# Patient Record
Sex: Female | Born: 1950 | Race: White | Hispanic: No | Marital: Married
Health system: Southern US, Community
[De-identification: ages and names within clinical notes are randomized; demographics above are authoritative.]

## PROBLEM LIST (undated history)

## (undated) DIAGNOSIS — E785 Hyperlipidemia, unspecified: Secondary | ICD-10-CM

## (undated) DIAGNOSIS — C50919 Malignant neoplasm of unspecified site of unspecified female breast: Secondary | ICD-10-CM

## (undated) HISTORY — DX: Hyperlipidemia, unspecified: E78.5

## (undated) HISTORY — DX: Malignant neoplasm of unspecified site of unspecified female breast: C50.919

---

## 1998-12-25 ENCOUNTER — Other Ambulatory Visit: Admission: RE | Admit: 1998-12-25 | Discharge: 1998-12-25 | Payer: Self-pay | Admitting: *Deleted

## 1999-02-05 ENCOUNTER — Ambulatory Visit (HOSPITAL_BASED_OUTPATIENT_CLINIC_OR_DEPARTMENT_OTHER): Admission: RE | Admit: 1999-02-05 | Discharge: 1999-02-05 | Payer: Self-pay | Admitting: *Deleted

## 1999-02-05 ENCOUNTER — Encounter: Payer: Self-pay | Admitting: *Deleted

## 1999-02-24 ENCOUNTER — Inpatient Hospital Stay (HOSPITAL_COMMUNITY): Admission: RE | Admit: 1999-02-24 | Discharge: 1999-02-25 | Payer: Self-pay | Admitting: *Deleted

## 2000-02-25 ENCOUNTER — Encounter (INDEPENDENT_AMBULATORY_CARE_PROVIDER_SITE_OTHER): Payer: Self-pay | Admitting: *Deleted

## 2000-02-25 ENCOUNTER — Ambulatory Visit (HOSPITAL_COMMUNITY): Admission: RE | Admit: 2000-02-25 | Discharge: 2000-02-25 | Payer: Self-pay | Admitting: *Deleted

## 2000-04-09 ENCOUNTER — Other Ambulatory Visit: Admission: RE | Admit: 2000-04-09 | Discharge: 2000-04-09 | Payer: Self-pay | Admitting: Obstetrics & Gynecology

## 2001-02-19 ENCOUNTER — Encounter: Payer: Self-pay | Admitting: Plastic Surgery

## 2001-02-19 ENCOUNTER — Ambulatory Visit: Admission: AD | Admit: 2001-02-19 | Discharge: 2001-02-19 | Payer: Self-pay | Admitting: Plastic Surgery

## 2001-02-22 ENCOUNTER — Encounter: Payer: Self-pay | Admitting: Emergency Medicine

## 2001-02-22 ENCOUNTER — Emergency Department (HOSPITAL_COMMUNITY): Admission: EM | Admit: 2001-02-22 | Discharge: 2001-02-22 | Payer: Self-pay | Admitting: Emergency Medicine

## 2001-07-07 ENCOUNTER — Encounter (INDEPENDENT_AMBULATORY_CARE_PROVIDER_SITE_OTHER): Payer: Self-pay | Admitting: Specialist

## 2001-07-07 ENCOUNTER — Ambulatory Visit (HOSPITAL_BASED_OUTPATIENT_CLINIC_OR_DEPARTMENT_OTHER): Admission: RE | Admit: 2001-07-07 | Discharge: 2001-07-07 | Payer: Self-pay | Admitting: General Surgery

## 2001-08-27 ENCOUNTER — Other Ambulatory Visit: Admission: RE | Admit: 2001-08-27 | Discharge: 2001-08-27 | Payer: Self-pay | Admitting: Obstetrics & Gynecology

## 2001-08-28 ENCOUNTER — Emergency Department (HOSPITAL_COMMUNITY): Admission: EM | Admit: 2001-08-28 | Discharge: 2001-08-28 | Payer: Self-pay | Admitting: Emergency Medicine

## 2002-07-26 ENCOUNTER — Encounter: Admission: RE | Admit: 2002-07-26 | Discharge: 2002-07-26 | Payer: Self-pay | Admitting: Emergency Medicine

## 2002-07-26 ENCOUNTER — Encounter: Payer: Self-pay | Admitting: General Surgery

## 2002-07-26 ENCOUNTER — Encounter (INDEPENDENT_AMBULATORY_CARE_PROVIDER_SITE_OTHER): Payer: Self-pay | Admitting: Specialist

## 2002-07-26 ENCOUNTER — Encounter: Payer: Self-pay | Admitting: Emergency Medicine

## 2002-07-27 ENCOUNTER — Inpatient Hospital Stay (HOSPITAL_COMMUNITY): Admission: EM | Admit: 2002-07-27 | Discharge: 2002-07-29 | Payer: Self-pay | Admitting: General Surgery

## 2002-07-27 ENCOUNTER — Encounter: Payer: Self-pay | Admitting: General Surgery

## 2002-09-24 ENCOUNTER — Other Ambulatory Visit: Admission: RE | Admit: 2002-09-24 | Discharge: 2002-09-24 | Payer: Self-pay | Admitting: Obstetrics & Gynecology

## 2003-03-02 ENCOUNTER — Ambulatory Visit (HOSPITAL_COMMUNITY): Admission: RE | Admit: 2003-03-02 | Discharge: 2003-03-02 | Payer: Self-pay | Admitting: *Deleted

## 2003-07-01 ENCOUNTER — Encounter: Admission: RE | Admit: 2003-07-01 | Discharge: 2003-07-01 | Payer: Self-pay | Admitting: Family Medicine

## 2003-07-01 ENCOUNTER — Encounter: Payer: Self-pay | Admitting: Family Medicine

## 2003-10-27 ENCOUNTER — Other Ambulatory Visit: Admission: RE | Admit: 2003-10-27 | Discharge: 2003-10-27 | Payer: Self-pay | Admitting: Obstetrics & Gynecology

## 2004-10-31 ENCOUNTER — Inpatient Hospital Stay (HOSPITAL_COMMUNITY): Admission: RE | Admit: 2004-10-31 | Discharge: 2004-11-02 | Payer: Self-pay | Admitting: Plastic Surgery

## 2004-12-04 ENCOUNTER — Other Ambulatory Visit: Admission: RE | Admit: 2004-12-04 | Discharge: 2004-12-04 | Payer: Self-pay | Admitting: Obstetrics & Gynecology

## 2005-03-27 ENCOUNTER — Ambulatory Visit: Payer: Self-pay | Admitting: Oncology

## 2005-07-31 ENCOUNTER — Encounter (INDEPENDENT_AMBULATORY_CARE_PROVIDER_SITE_OTHER): Payer: Self-pay | Admitting: Specialist

## 2005-07-31 ENCOUNTER — Ambulatory Visit (HOSPITAL_BASED_OUTPATIENT_CLINIC_OR_DEPARTMENT_OTHER): Admission: RE | Admit: 2005-07-31 | Discharge: 2005-07-31 | Payer: Self-pay | Admitting: Plastic Surgery

## 2005-09-26 ENCOUNTER — Ambulatory Visit: Payer: Self-pay | Admitting: Oncology

## 2005-12-09 ENCOUNTER — Other Ambulatory Visit: Admission: RE | Admit: 2005-12-09 | Discharge: 2005-12-09 | Payer: Self-pay | Admitting: Obstetrics & Gynecology

## 2006-03-26 ENCOUNTER — Encounter: Admission: RE | Admit: 2006-03-26 | Discharge: 2006-03-26 | Payer: Self-pay | Admitting: General Surgery

## 2006-03-31 ENCOUNTER — Encounter: Admission: RE | Admit: 2006-03-31 | Discharge: 2006-03-31 | Payer: Self-pay | Admitting: Radiology

## 2006-04-01 ENCOUNTER — Ambulatory Visit: Payer: Self-pay | Admitting: Oncology

## 2006-12-29 ENCOUNTER — Ambulatory Visit: Payer: Self-pay | Admitting: Oncology

## 2007-06-30 ENCOUNTER — Ambulatory Visit: Payer: Self-pay | Admitting: Oncology

## 2007-07-02 ENCOUNTER — Ambulatory Visit (HOSPITAL_COMMUNITY): Admission: RE | Admit: 2007-07-02 | Discharge: 2007-07-02 | Payer: Self-pay | Admitting: Oncology

## 2007-12-30 ENCOUNTER — Ambulatory Visit: Payer: Self-pay | Admitting: Oncology

## 2008-06-27 ENCOUNTER — Ambulatory Visit: Payer: Self-pay | Admitting: Oncology

## 2008-12-27 ENCOUNTER — Ambulatory Visit: Payer: Self-pay | Admitting: Oncology

## 2009-12-29 ENCOUNTER — Ambulatory Visit: Payer: Self-pay | Admitting: Oncology

## 2010-04-25 ENCOUNTER — Ambulatory Visit: Payer: Self-pay | Admitting: Oncology

## 2010-10-30 ENCOUNTER — Ambulatory Visit: Payer: Self-pay | Admitting: Oncology

## 2011-05-02 ENCOUNTER — Encounter (HOSPITAL_BASED_OUTPATIENT_CLINIC_OR_DEPARTMENT_OTHER): Payer: Federal, State, Local not specified - PPO | Admitting: Oncology

## 2011-05-02 DIAGNOSIS — C50519 Malignant neoplasm of lower-outer quadrant of unspecified female breast: Secondary | ICD-10-CM

## 2011-05-10 NOTE — Op Note (Signed)
NAME:  Rachel Stephens, Rachel Stephens               ACCOUNT NO.:  [id number]   MEDICAL RECORD NO.:  [id number]          PATIENT TYPE:  AMB   LOCATION:  DSC                          FACILITY:  [facility]   PHYSICIAN:  Alfredia Ferguson, M.D.  DATE OF BIRTH:  1951/04/11   DATE OF PROCEDURE:  07/31/2005  DATE OF DISCHARGE:                                 OPERATIVE REPORT   PREOPERATIVE DIAGNOSIS:  1.  A dermatofibroma right inner thigh 4 mm.  2.  Skin lesion left forehead 6 mm.  3.  Acquired absence right breast.  4.  History of breast cancer.   POSTOP DIAGNOSIS:  1.  A dermatofibroma right inner thigh 4 mm.  2.  Skin lesion left forehead 6 mm.  3.  Acquired absence right breast.  4.  History of breast cancer.   OPERATION PERFORMED:  1.  Excision lesion right inner thigh.  2.  Excision lesion left forehead.  3.  Nipple reconstruction, right reconstructed breast using tripartite flap.   SURGEON:  Alfredia Ferguson, M.D.   ANESTHESIA:  2% Xylocaine 1: 100,000 epinephrine for the right inner thigh  and the left forehead. No anesthesia required from the reconstruction on the  nipple.   INDICATIONS FOR SURGERY:  This is a 60 year old woman who is about 6 or 8  months since having a TRAM flap of breast reconstruction. She wishes to  undergo nipple reconstruction. She also has a dermatofibroma in the right  inner thigh which has been getting larger; and she wishes to have removed.  She also has a lesion in her left forehead which looks like a sebaceous  hyperplasia, but she would like to have it removed and biopsied. She  understands the risk of failure of the nipple flap due to vascular  compromise. She understands the risk of scarring at both excision sites. In  spite of that the patient wishes to proceed.   DESCRIPTION OF SURGERY:  Skin marks were placed for the nipple  reconstruction with the patient in a sitting position, locating the nipple  relative to her opposite breast. Skin marks were also  placed around the  lesion on the forehead and the right inner thigh. 2% Xylocaine 1:100,000  epinephrine was infiltrated in the right inner thigh and left forehead. No  anesthesia required in the nipple reconstruction.   The attention was first directed to the breast. The right reconstructed  breast was prepped and Betadine and draped with sterile drapes. A tripartite  flap with the 3-points of the flap pointing to the 3 o'clock, 9 o'clock and  12 o'clock position was elevated with a 2-cm skin pedicle. The two  flaps  pointing to the 9 o'clock and 3 o'clock position were rolled towards one  another as the stripes on a Nurse, learning disability.  The tip of one flap was sutured to  the crotch of the opposite flap. This was done with the second flap rotating  to the upper crotch of the first flap. This created a cylinder. The 12  o'clock flap was sutured down on top of this cylinder to close the cylinder.  A 4-0 chromic suture was used. The donor site was closed with interrupted 4-  0 PDS for the dermis. Vascularity of the flap looked to be excellent.   The lesion of the left forehead was now excised in an elliptical fashion  down to the level of subcutaneous tissue. Specimen was submitted for  pathology. The wound was closed with interrupted 5-0 Monocryl for the dermis  followed by running 6-0 nylon for the skin edges.   Finally an excision of the lesion on the right inner thigh was carried out;  and the specimen submitted for pathology. The incision was closed with  interrupted 6-0 nylon sutures. The patient tolerated the procedure well. All  areas were cleansed and dried and light dressings were applied. The patient  was discharged home in satisfactory condition.      Alfredia Ferguson, M.D.  Electronically Signed     WBB/MEDQ  D:  07/31/2005  T:  07/31/2005  Job:  [id number]

## 2011-05-10 NOTE — Discharge Summary (Signed)
NAME:  Rachel Stephens, Rachel Stephens               ACCOUNT NO.:  [id number]   MEDICAL RECORD NO.:  [id number]          PATIENT TYPE:  INP   LOCATION:  5732                         FACILITY:  [facility]   PHYSICIAN:  Alfredia Ferguson, M.D.  DATE OF BIRTH:  [birth year]   DATE OF ADMISSION:  10/31/2004  DATE OF DISCHARGE:                                 DISCHARGE SUMMARY   ADMISSION DIAGNOSIS:  1.  History of breast cancer.  2.  Acquired absence of the right breast.   DISCHARGE DIAGNOSIS:  1.  History of breast cancer.  2.  Acquired absence of the right breast.   OPERATION PERFORMED:  Delayed breast reconstruction with right transverse  rectus abdominis myocutaneous flap performed on October 31, 2004.   CHIEF COMPLAINT:  I had breast cancer and had my breast removed and would  like to have breast reconstruction.   HISTORY OF PRESENT ILLNESS:  This is a 60 year old woman who has a history  of breast cancer.  She underwent a mastectomy of her right breast in 2000.  She received chemotherapy following the surgery.  She has now opted to  undergo delayed breast reconstruction.  She was advised regarding the  options.  She wishes to proceed with a transverse rectus abdominis  myocutaneous flap.  The patient was advised in the office about the  potential complications of the surgery, but in spite of that, wishes to  proceed.   PAST MEDICAL HISTORY:  Significant for asthma.  She also has occasional  joint swelling.  She wears corrective lenses.   PAST SURGICAL HISTORY:  Fistula surgery in 1990, nodule from the left leg  removed in 2002, cholecystectomy in 2003, polypectomy via colonoscopy in  2001, mastectomy in 2000.   ALLERGIES:  Demerol, Bactrim, Levsin, Zithromax, sulfa, Augmentin,  Cefuroxime.  She is also allergic to dust and mold.   CURRENT MEDICATIONS:  Femara, Caltrate, Albuterol, Afrin, Centrum, and  Zyrtec.   ADMISSION LABORATORY DATA:  Urinalysis was normal.  CBC revealed hemoglobin  12.9  and hematocrit 37, white count 5.4.  Routine chemistries were normal.  Chest x-ray revealed mild hyperinflation with no evidence of acute or  superimposed cardiopulmonary disease.  There was also evidence of a previous  mastectomy.  Electrocardiogram revealed normal sinus rhythm.   HOSPITAL COURSE:  On the day of admission, the patient underwent a right  TRAM flap breast reconstruction.  Her postoperative course was uneventful.  The patient was advanced to a diet on the night of surgery and advanced to  regular food the following day.  The patient did have one syncopal episode  on her first time out of bed on the first postoperative day from which she  recovered quickly.  Her ambulation continued later that day and she is now  able to ambulate without difficulty and only with minor assistance.  She is  tolerating a regular diet and is on oral medications.  The TRAM flap looks  good.  It has excellent color and excellent capillary refill.  There is no  evidence of hematoma, seroma, or fat necrosis.  The abdominal incision is  healing nicely with no evidence of seroma, hematoma, or infection.  She does  have some tape blisters.  I removed all the tape from the patient and am  going to leave it off.  The patient has two drains which she will take home  with her.  She has been instructed on drain care at home.  She understands  how to empty the drains and record the output.   DISCHARGE MEDICATIONS:  Vicodin and Keflex.   DISCHARGE INSTRUCTIONS:  The patient has been provided with written  postoperative instructions.  Follow up in approximately five days in my  office.      Tiburcio Pea  D:  11/02/2004  T:  11/02/2004  Job:  [id number]

## 2011-05-10 NOTE — Procedures (Signed)
[facility]  Patient:    Rachel Stephens, Rachel Stephens                      MRN: [id number] Proc. Date: 02/25/00 Adm. Date:  [id number] Attending:  Mingo Amber CC:         Duncan Dull, M.D.                           Procedure Report  PROCEDURE:  Video colonoscopy.  ENDOSCOPIST:  Roosvelt Harps, M.D.  INDICATIONS:  Screening for a bowel neoplasia, status post rectovaginal fistula  repair, and history of breast cancer.  PREPARATION:  She is n.p.o. since midnight, having taken Phospho-Soda prep and  clear liquid diet.  The mucosa throughout was clean.  DEPTH OF INSERTION:  Cecum.  PREPROCEDURE SEDATION:  She received a total of 80 mg of Demerol and 6 mg of Versed intravenously.  In addition, she was on 2 L of nasal cannula O2.  DESCRIPTION OF PROCEDURE:  The Olympus video colonoscope was inserted via the rectum and advanced easily all the way to the cecum.  Cecal landmarks were identified and photographed.  On withdrawal, the mucosa was carefully evaluated. In the proximal ascending colon and in the mid ascending colon were two diminutive polyps which were removed with hot biopsy forceps.  The remainder of the mucosa  from distal ascending to retroflexed view of the rectum was unremarkable. There were no other areas of neoplasia and no diverticulosis or inflammation were noted.  The patient tolerated the procedure well.  Pulse, blood pressure, and oximetry testing were stable throughout.  She was observed in recovery for 45 minutes and discharged home, alert with a benign abdomen.  IMPRESSION:  Status post removal of two diminutive ascending colon polyps, otherwise normal colonoscopy.  PLAN:  The patient will receive a letter advising her of the polyp histology and any necessary followup.  If it proves to be adenomatous, consideration should be given to repeat colonoscopy in three years. DD:  02/25/00 TD:  02/26/00 Job:  [id number]

## 2011-11-30 ENCOUNTER — Telehealth: Payer: Self-pay | Admitting: Oncology

## 2011-11-30 NOTE — Telephone Encounter (Signed)
S/w the pt and she is aware of her may 2013 appt

## 2011-12-20 ENCOUNTER — Telehealth: Payer: Self-pay | Admitting: Oncology

## 2011-12-20 NOTE — Telephone Encounter (Signed)
S/w the pt and she is aware of the mammogram appt in may 2013 with the [facility]

## 2012-04-30 ENCOUNTER — Telehealth: Payer: Self-pay | Admitting: Oncology

## 2012-04-30 ENCOUNTER — Ambulatory Visit (HOSPITAL_BASED_OUTPATIENT_CLINIC_OR_DEPARTMENT_OTHER): Payer: Federal, State, Local not specified - PPO | Admitting: Oncology

## 2012-04-30 VITALS — BP 108/66 | HR 73 | Temp 97.0°F | Ht 61.5 in | Wt 136.7 lb

## 2012-04-30 DIAGNOSIS — Z853 Personal history of malignant neoplasm of breast: Secondary | ICD-10-CM

## 2012-04-30 DIAGNOSIS — C50919 Malignant neoplasm of unspecified site of unspecified female breast: Secondary | ICD-10-CM

## 2012-04-30 NOTE — Telephone Encounter (Signed)
gve the pt her may 2014 appt calendar along with the mammogram appt

## 2012-04-30 NOTE — Progress Notes (Signed)
OFFICE PROGRESS NOTE   INTERVAL HISTORY:   She returns as scheduled. She feels well. She has an occasional "tingling" sensation at the right posterior lateral chest wall. No pain. No change at the right chest wall or left breast. A mammogram was negative in March of 2013.  She reports the asthma is inactive at present.  Objective:  Vital signs in last 24 hours:  Blood pressure 108/66, pulse 73, temperature 97 F (36.1 C), temperature source Oral, height 5' 1.5" (1.562 m), weight 136 lb 11.2 oz (62.007 kg).    HEENT: Neck without mass Lymphatics: No cervical, supraclavicular, axillary, or inguinal nodes Resp: End inspiratory rails/rhonchi at the lower posterior chest bilaterally, no respiratory distress. No wheezing. Cardio: Regular rate and rhythm GI: No hepatomegaly Vascular: No leg edema Breasts: Status post right mastectomy with a TRAM reconstruction. No evidence for chest wall tumor recurrence. Left breast without mass.     Medications: I have reviewed the patient's current medications.  Assessment/Plan:  Rachel Stephens has a history of stage II right-sided breast cancer dating to February 2000.  She completed adjuvant Femara in June 2010.     Disposition:  She remains in clinical remission from breast cancer. She would like to continue followup at the cancer Center. She will be scheduled for a mammogram in March of 2014. She will return for an office visit in one year.   Thornton Papas, MD  04/30/2012  12:06 PM

## 2013-03-25 ENCOUNTER — Other Ambulatory Visit: Payer: Self-pay | Admitting: Gastroenterology

## 2013-04-01 ENCOUNTER — Encounter: Payer: Self-pay | Admitting: Oncology

## 2013-04-29 ENCOUNTER — Ambulatory Visit (HOSPITAL_BASED_OUTPATIENT_CLINIC_OR_DEPARTMENT_OTHER): Payer: Federal, State, Local not specified - PPO | Admitting: Oncology

## 2013-04-29 ENCOUNTER — Telehealth: Payer: Self-pay | Admitting: Oncology

## 2013-04-29 VITALS — BP 107/65 | HR 63 | Temp 97.8°F | Resp 18 | Ht 61.5 in | Wt 130.9 lb

## 2013-04-29 DIAGNOSIS — C50919 Malignant neoplasm of unspecified site of unspecified female breast: Secondary | ICD-10-CM

## 2013-04-29 DIAGNOSIS — Z853 Personal history of malignant neoplasm of breast: Secondary | ICD-10-CM

## 2013-04-29 DIAGNOSIS — N899 Noninflammatory disorder of vagina, unspecified: Secondary | ICD-10-CM

## 2013-04-29 NOTE — Progress Notes (Signed)
   [facility]    OFFICE PROGRESS NOTE   INTERVAL HISTORY:   She returns as scheduled. She feels well. She reports vaginal dryness. Dr. Carmon Ginsberg has recommended a vaginal cream with estrogen. She is hesitant to use the cream secondary to the potential cancer risk. A left mammogram was negative on 04/01/2013.  Objective:  Vital signs in last 24 hours:  Blood pressure 107/65, pulse 63, temperature 97.8 F (36.6 C), temperature source Oral, resp. rate 18, height 5' 1.5" (1.562 m), weight 130 lb 14.4 oz (59.376 kg).    HEENT: Neck without Lymphatics: No cervical, supra-clavicular, or axillary nodes Resp: End inspiratory rhonchi at the upper posterior chest bilaterally, no wheezing, no respiratory distress Cardio: Regular rate and rhythm GI: No hepatomegaly Vascular: No leg edema Breasts: Status post right mastectomy with a TRAM reconstruction. 1 cm area of firm nodularity at the upper medial aspect of the TRAM. Left breast without mass.   Medications: I have reviewed the patient's current medications.  Assessment/Plan: Rachel Stephens has a history of stage II right-sided breast cancer dating to February 2000. She completed adjuvant Femara in June 2010. She remains in clinical remission.   Disposition:  She will schedule a left mammogram for April of 2015. Rachel Stephens will return for an office visit in one year.  We discussed the indication for a vaginal estrogen preparation to relieve dryness. I explained the theoretical slight increase risk of developing breast cancer when using vaginal hormonal therapy. She plans to try a non-estrogen based cream and if this does not work she will try the hormonal therapy prescribed by Dr. Evalina Field, Rachel Hidden, MD  04/29/2013  5:10 PM

## 2013-04-29 NOTE — Telephone Encounter (Signed)
gv and printed appt sched and avs to pt for May 2015

## 2014-04-15 ENCOUNTER — Encounter: Payer: Self-pay | Admitting: Oncology

## 2014-04-28 ENCOUNTER — Ambulatory Visit (HOSPITAL_BASED_OUTPATIENT_CLINIC_OR_DEPARTMENT_OTHER): Payer: Federal, State, Local not specified - PPO | Admitting: Oncology

## 2014-04-28 VITALS — BP 101/58 | HR 72 | Temp 98.1°F | Resp 18 | Ht 61.0 in | Wt 134.5 lb

## 2014-04-28 DIAGNOSIS — Z853 Personal history of malignant neoplasm of breast: Secondary | ICD-10-CM

## 2014-04-28 DIAGNOSIS — C50919 Malignant neoplasm of unspecified site of unspecified female breast: Secondary | ICD-10-CM

## 2014-04-28 NOTE — Progress Notes (Signed)
  [place] OFFICE PROGRESS NOTE   Diagnosis: Breast cancer  INTERVAL HISTORY:   She returns as scheduled. Occasional discomfort at the lateral aspect of the TRAM reconstruction. No consistent pain. No palpable change. She feels well. The asthma improved when she began exercising.  She has not started an estrogen cream. A bilateral mammogram on 03/11/2014 was negative.   Objective:  Vital signs in last 24 hours:  Blood pressure 101/58, pulse 72, temperature 98.1 F (36.7 C), temperature source Oral, resp. rate 18, height 5\' 1"  (1.549 m), weight 134 lb 8 oz (61.009 kg), SpO2 96.00%.    HEENT: Neck without mass Lymphatics: No cervical, supra-clavicular, or axillary nodes Resp: Lungs clear bilaterally Cardio: Regular rate and rhythm GI: No hepatomegaly Vascular: No leg edema Breasts: Status post right mastectomy with a TRAM reconstruction. 1 cm area of firm nodularity at the upper medial aspect of the TRAM .No evidence for chest wall tumor recurrence. Left breast without mass.   Medications: I have reviewed the patient's current medications.  Assessment/Plan: Ms. Palo has a history of stage II right-sided breast cancer dating to February 2000. She completed adjuvant Femara in June 2010. She remains in clinical remission.    Disposition:  Ms. Sonntag will continue clinical followup with Dr. Inda Merlin and Nori Riis. She is not scheduled for a followup appointment at the [facility]. We will see her in the future as needed.  Ladell Pier, MD  04/28/2014  3:16 PM

## 2015-04-04 ENCOUNTER — Other Ambulatory Visit: Payer: Self-pay | Admitting: Obstetrics & Gynecology

## 2015-04-05 LAB — CYTOLOGY - PAP

## 2016-03-28 DIAGNOSIS — J3089 Other allergic rhinitis: Secondary | ICD-10-CM | POA: Diagnosis not present

## 2016-04-11 DIAGNOSIS — Z01419 Encounter for gynecological examination (general) (routine) without abnormal findings: Secondary | ICD-10-CM | POA: Diagnosis not present

## 2016-04-11 DIAGNOSIS — Z6826 Body mass index (BMI) 26.0-26.9, adult: Secondary | ICD-10-CM | POA: Diagnosis not present

## 2016-04-11 DIAGNOSIS — R1031 Right lower quadrant pain: Secondary | ICD-10-CM | POA: Diagnosis not present

## 2016-04-17 DIAGNOSIS — J3089 Other allergic rhinitis: Secondary | ICD-10-CM | POA: Diagnosis not present

## 2016-04-25 DIAGNOSIS — J3089 Other allergic rhinitis: Secondary | ICD-10-CM | POA: Diagnosis not present

## 2016-05-02 DIAGNOSIS — J3089 Other allergic rhinitis: Secondary | ICD-10-CM | POA: Diagnosis not present

## 2016-05-22 DIAGNOSIS — J3089 Other allergic rhinitis: Secondary | ICD-10-CM | POA: Diagnosis not present

## 2016-05-27 DIAGNOSIS — K080 Exfoliation of teeth due to systemic causes: Secondary | ICD-10-CM | POA: Diagnosis not present

## 2016-05-29 DIAGNOSIS — J3089 Other allergic rhinitis: Secondary | ICD-10-CM | POA: Diagnosis not present

## 2016-06-05 DIAGNOSIS — J3089 Other allergic rhinitis: Secondary | ICD-10-CM | POA: Diagnosis not present

## 2016-06-12 DIAGNOSIS — J3089 Other allergic rhinitis: Secondary | ICD-10-CM | POA: Diagnosis not present

## 2016-06-19 DIAGNOSIS — J3089 Other allergic rhinitis: Secondary | ICD-10-CM | POA: Diagnosis not present

## 2016-06-26 DIAGNOSIS — J3089 Other allergic rhinitis: Secondary | ICD-10-CM | POA: Diagnosis not present

## 2016-07-03 DIAGNOSIS — J3089 Other allergic rhinitis: Secondary | ICD-10-CM | POA: Diagnosis not present

## 2016-07-10 DIAGNOSIS — J3089 Other allergic rhinitis: Secondary | ICD-10-CM | POA: Diagnosis not present

## 2016-07-17 DIAGNOSIS — J3089 Other allergic rhinitis: Secondary | ICD-10-CM | POA: Diagnosis not present

## 2016-07-24 DIAGNOSIS — J3089 Other allergic rhinitis: Secondary | ICD-10-CM | POA: Diagnosis not present

## 2016-07-31 DIAGNOSIS — J3089 Other allergic rhinitis: Secondary | ICD-10-CM | POA: Diagnosis not present

## 2016-08-06 DIAGNOSIS — J3089 Other allergic rhinitis: Secondary | ICD-10-CM | POA: Diagnosis not present

## 2016-08-07 DIAGNOSIS — J3089 Other allergic rhinitis: Secondary | ICD-10-CM | POA: Diagnosis not present

## 2016-08-14 DIAGNOSIS — J3089 Other allergic rhinitis: Secondary | ICD-10-CM | POA: Diagnosis not present

## 2016-08-23 DIAGNOSIS — J3089 Other allergic rhinitis: Secondary | ICD-10-CM | POA: Diagnosis not present

## 2016-08-29 DIAGNOSIS — J3089 Other allergic rhinitis: Secondary | ICD-10-CM | POA: Diagnosis not present

## 2016-08-30 DIAGNOSIS — K080 Exfoliation of teeth due to systemic causes: Secondary | ICD-10-CM | POA: Diagnosis not present

## 2016-09-04 DIAGNOSIS — J3089 Other allergic rhinitis: Secondary | ICD-10-CM | POA: Diagnosis not present

## 2016-09-11 DIAGNOSIS — J3089 Other allergic rhinitis: Secondary | ICD-10-CM | POA: Diagnosis not present

## 2016-09-13 DIAGNOSIS — D2239 Melanocytic nevi of other parts of face: Secondary | ICD-10-CM | POA: Diagnosis not present

## 2016-09-13 DIAGNOSIS — D1801 Hemangioma of skin and subcutaneous tissue: Secondary | ICD-10-CM | POA: Diagnosis not present

## 2016-09-13 DIAGNOSIS — D225 Melanocytic nevi of trunk: Secondary | ICD-10-CM | POA: Diagnosis not present

## 2016-09-13 DIAGNOSIS — D2262 Melanocytic nevi of left upper limb, including shoulder: Secondary | ICD-10-CM | POA: Diagnosis not present

## 2016-09-18 DIAGNOSIS — J3089 Other allergic rhinitis: Secondary | ICD-10-CM | POA: Diagnosis not present

## 2016-09-25 DIAGNOSIS — J3089 Other allergic rhinitis: Secondary | ICD-10-CM | POA: Diagnosis not present

## 2016-10-02 DIAGNOSIS — J3089 Other allergic rhinitis: Secondary | ICD-10-CM | POA: Diagnosis not present

## 2016-10-07 DIAGNOSIS — N952 Postmenopausal atrophic vaginitis: Secondary | ICD-10-CM | POA: Diagnosis not present

## 2016-10-09 DIAGNOSIS — J3089 Other allergic rhinitis: Secondary | ICD-10-CM | POA: Diagnosis not present

## 2016-10-16 DIAGNOSIS — J3089 Other allergic rhinitis: Secondary | ICD-10-CM | POA: Diagnosis not present

## 2016-10-17 DIAGNOSIS — Z23 Encounter for immunization: Secondary | ICD-10-CM | POA: Diagnosis not present

## 2016-10-23 DIAGNOSIS — J3089 Other allergic rhinitis: Secondary | ICD-10-CM | POA: Diagnosis not present

## 2016-10-30 DIAGNOSIS — J3089 Other allergic rhinitis: Secondary | ICD-10-CM | POA: Diagnosis not present

## 2016-11-06 DIAGNOSIS — J3089 Other allergic rhinitis: Secondary | ICD-10-CM | POA: Diagnosis not present

## 2016-11-20 DIAGNOSIS — J3089 Other allergic rhinitis: Secondary | ICD-10-CM | POA: Diagnosis not present

## 2016-11-27 DIAGNOSIS — J3089 Other allergic rhinitis: Secondary | ICD-10-CM | POA: Diagnosis not present

## 2016-12-04 DIAGNOSIS — J3089 Other allergic rhinitis: Secondary | ICD-10-CM | POA: Diagnosis not present

## 2016-12-09 DIAGNOSIS — J3089 Other allergic rhinitis: Secondary | ICD-10-CM | POA: Diagnosis not present

## 2016-12-11 DIAGNOSIS — J3089 Other allergic rhinitis: Secondary | ICD-10-CM | POA: Diagnosis not present

## 2016-12-19 DIAGNOSIS — J3089 Other allergic rhinitis: Secondary | ICD-10-CM | POA: Diagnosis not present

## 2016-12-26 DIAGNOSIS — J3089 Other allergic rhinitis: Secondary | ICD-10-CM | POA: Diagnosis not present

## 2017-01-02 DIAGNOSIS — J3089 Other allergic rhinitis: Secondary | ICD-10-CM | POA: Diagnosis not present

## 2017-01-23 DIAGNOSIS — R05 Cough: Secondary | ICD-10-CM | POA: Diagnosis not present

## 2017-01-23 DIAGNOSIS — H1089 Other conjunctivitis: Secondary | ICD-10-CM | POA: Diagnosis not present

## 2017-01-24 DIAGNOSIS — J4520 Mild intermittent asthma, uncomplicated: Secondary | ICD-10-CM | POA: Diagnosis not present

## 2017-01-24 DIAGNOSIS — H1045 Other chronic allergic conjunctivitis: Secondary | ICD-10-CM | POA: Diagnosis not present

## 2017-01-24 DIAGNOSIS — J3089 Other allergic rhinitis: Secondary | ICD-10-CM | POA: Diagnosis not present

## 2017-02-06 DIAGNOSIS — E7800 Pure hypercholesterolemia, unspecified: Secondary | ICD-10-CM | POA: Diagnosis not present

## 2017-02-06 DIAGNOSIS — Z23 Encounter for immunization: Secondary | ICD-10-CM | POA: Diagnosis not present

## 2017-02-06 DIAGNOSIS — Z0000 Encounter for general adult medical examination without abnormal findings: Secondary | ICD-10-CM | POA: Diagnosis not present

## 2017-02-06 DIAGNOSIS — E559 Vitamin D deficiency, unspecified: Secondary | ICD-10-CM | POA: Diagnosis not present

## 2017-02-13 DIAGNOSIS — J3089 Other allergic rhinitis: Secondary | ICD-10-CM | POA: Diagnosis not present

## 2017-02-19 DIAGNOSIS — J3089 Other allergic rhinitis: Secondary | ICD-10-CM | POA: Diagnosis not present

## 2017-02-26 DIAGNOSIS — J3089 Other allergic rhinitis: Secondary | ICD-10-CM | POA: Diagnosis not present

## 2017-03-05 DIAGNOSIS — J3089 Other allergic rhinitis: Secondary | ICD-10-CM | POA: Diagnosis not present

## 2017-03-12 DIAGNOSIS — J3089 Other allergic rhinitis: Secondary | ICD-10-CM | POA: Diagnosis not present

## 2017-03-18 DIAGNOSIS — J3089 Other allergic rhinitis: Secondary | ICD-10-CM | POA: Diagnosis not present

## 2017-03-26 DIAGNOSIS — J3089 Other allergic rhinitis: Secondary | ICD-10-CM | POA: Diagnosis not present

## 2017-03-26 DIAGNOSIS — Z853 Personal history of malignant neoplasm of breast: Secondary | ICD-10-CM | POA: Diagnosis not present

## 2017-03-26 DIAGNOSIS — Z1231 Encounter for screening mammogram for malignant neoplasm of breast: Secondary | ICD-10-CM | POA: Diagnosis not present

## 2017-03-27 DIAGNOSIS — K080 Exfoliation of teeth due to systemic causes: Secondary | ICD-10-CM | POA: Diagnosis not present

## 2017-04-02 DIAGNOSIS — J3089 Other allergic rhinitis: Secondary | ICD-10-CM | POA: Diagnosis not present

## 2017-04-09 DIAGNOSIS — J3089 Other allergic rhinitis: Secondary | ICD-10-CM | POA: Diagnosis not present

## 2017-04-17 DIAGNOSIS — Z6825 Body mass index (BMI) 25.0-25.9, adult: Secondary | ICD-10-CM | POA: Diagnosis not present

## 2017-04-17 DIAGNOSIS — Z124 Encounter for screening for malignant neoplasm of cervix: Secondary | ICD-10-CM | POA: Diagnosis not present

## 2017-04-17 DIAGNOSIS — Z01419 Encounter for gynecological examination (general) (routine) without abnormal findings: Secondary | ICD-10-CM | POA: Diagnosis not present

## 2017-04-24 DIAGNOSIS — J3089 Other allergic rhinitis: Secondary | ICD-10-CM | POA: Diagnosis not present

## 2017-04-30 DIAGNOSIS — K080 Exfoliation of teeth due to systemic causes: Secondary | ICD-10-CM | POA: Diagnosis not present

## 2017-05-01 DIAGNOSIS — J3089 Other allergic rhinitis: Secondary | ICD-10-CM | POA: Diagnosis not present

## 2017-05-08 DIAGNOSIS — J3089 Other allergic rhinitis: Secondary | ICD-10-CM | POA: Diagnosis not present

## 2017-05-14 DIAGNOSIS — J3089 Other allergic rhinitis: Secondary | ICD-10-CM | POA: Diagnosis not present

## 2017-05-22 DIAGNOSIS — J3089 Other allergic rhinitis: Secondary | ICD-10-CM | POA: Diagnosis not present

## 2017-05-29 DIAGNOSIS — J301 Allergic rhinitis due to pollen: Secondary | ICD-10-CM | POA: Diagnosis not present

## 2017-05-29 DIAGNOSIS — J3089 Other allergic rhinitis: Secondary | ICD-10-CM | POA: Diagnosis not present

## 2017-06-05 DIAGNOSIS — J3089 Other allergic rhinitis: Secondary | ICD-10-CM | POA: Diagnosis not present

## 2017-06-05 DIAGNOSIS — J301 Allergic rhinitis due to pollen: Secondary | ICD-10-CM | POA: Diagnosis not present

## 2017-06-12 DIAGNOSIS — J3089 Other allergic rhinitis: Secondary | ICD-10-CM | POA: Diagnosis not present

## 2017-06-17 DIAGNOSIS — J3089 Other allergic rhinitis: Secondary | ICD-10-CM | POA: Diagnosis not present

## 2017-06-19 DIAGNOSIS — J3089 Other allergic rhinitis: Secondary | ICD-10-CM | POA: Diagnosis not present

## 2017-06-26 DIAGNOSIS — J3089 Other allergic rhinitis: Secondary | ICD-10-CM | POA: Diagnosis not present

## 2017-07-03 DIAGNOSIS — J3089 Other allergic rhinitis: Secondary | ICD-10-CM | POA: Diagnosis not present

## 2017-07-12 DIAGNOSIS — J069 Acute upper respiratory infection, unspecified: Secondary | ICD-10-CM | POA: Diagnosis not present

## 2017-07-24 DIAGNOSIS — J3089 Other allergic rhinitis: Secondary | ICD-10-CM | POA: Diagnosis not present

## 2017-07-31 DIAGNOSIS — J3089 Other allergic rhinitis: Secondary | ICD-10-CM | POA: Diagnosis not present

## 2017-08-07 DIAGNOSIS — J3089 Other allergic rhinitis: Secondary | ICD-10-CM | POA: Diagnosis not present

## 2017-08-14 DIAGNOSIS — J3089 Other allergic rhinitis: Secondary | ICD-10-CM | POA: Diagnosis not present

## 2017-08-21 DIAGNOSIS — J3089 Other allergic rhinitis: Secondary | ICD-10-CM | POA: Diagnosis not present

## 2017-08-28 DIAGNOSIS — J3089 Other allergic rhinitis: Secondary | ICD-10-CM | POA: Diagnosis not present

## 2017-09-02 DIAGNOSIS — Z853 Personal history of malignant neoplasm of breast: Secondary | ICD-10-CM | POA: Diagnosis not present

## 2017-09-04 DIAGNOSIS — J3089 Other allergic rhinitis: Secondary | ICD-10-CM | POA: Diagnosis not present

## 2017-09-12 DIAGNOSIS — J3089 Other allergic rhinitis: Secondary | ICD-10-CM | POA: Diagnosis not present

## 2017-09-18 DIAGNOSIS — J3089 Other allergic rhinitis: Secondary | ICD-10-CM | POA: Diagnosis not present

## 2017-09-25 DIAGNOSIS — J3089 Other allergic rhinitis: Secondary | ICD-10-CM | POA: Diagnosis not present

## 2017-09-30 DIAGNOSIS — K080 Exfoliation of teeth due to systemic causes: Secondary | ICD-10-CM | POA: Diagnosis not present

## 2017-10-10 DIAGNOSIS — J3089 Other allergic rhinitis: Secondary | ICD-10-CM | POA: Diagnosis not present

## 2017-10-16 DIAGNOSIS — Z23 Encounter for immunization: Secondary | ICD-10-CM | POA: Diagnosis not present

## 2017-10-22 DIAGNOSIS — J3089 Other allergic rhinitis: Secondary | ICD-10-CM | POA: Diagnosis not present

## 2017-10-31 DIAGNOSIS — J3089 Other allergic rhinitis: Secondary | ICD-10-CM | POA: Diagnosis not present

## 2017-11-06 DIAGNOSIS — J3089 Other allergic rhinitis: Secondary | ICD-10-CM | POA: Diagnosis not present

## 2017-11-11 DIAGNOSIS — D2239 Melanocytic nevi of other parts of face: Secondary | ICD-10-CM | POA: Diagnosis not present

## 2017-11-11 DIAGNOSIS — L821 Other seborrheic keratosis: Secondary | ICD-10-CM | POA: Diagnosis not present

## 2017-11-11 DIAGNOSIS — D1801 Hemangioma of skin and subcutaneous tissue: Secondary | ICD-10-CM | POA: Diagnosis not present

## 2017-11-11 DIAGNOSIS — L853 Xerosis cutis: Secondary | ICD-10-CM | POA: Diagnosis not present

## 2017-11-12 DIAGNOSIS — J3089 Other allergic rhinitis: Secondary | ICD-10-CM | POA: Diagnosis not present

## 2017-11-20 DIAGNOSIS — J3089 Other allergic rhinitis: Secondary | ICD-10-CM | POA: Diagnosis not present

## 2017-11-27 DIAGNOSIS — J3089 Other allergic rhinitis: Secondary | ICD-10-CM | POA: Diagnosis not present

## 2017-12-04 DIAGNOSIS — J3089 Other allergic rhinitis: Secondary | ICD-10-CM | POA: Diagnosis not present

## 2017-12-12 DIAGNOSIS — J3089 Other allergic rhinitis: Secondary | ICD-10-CM | POA: Diagnosis not present

## 2017-12-18 DIAGNOSIS — J3089 Other allergic rhinitis: Secondary | ICD-10-CM | POA: Diagnosis not present

## 2017-12-25 DIAGNOSIS — J3089 Other allergic rhinitis: Secondary | ICD-10-CM | POA: Diagnosis not present

## 2018-01-02 DIAGNOSIS — J3089 Other allergic rhinitis: Secondary | ICD-10-CM | POA: Diagnosis not present

## 2018-01-08 DIAGNOSIS — J3089 Other allergic rhinitis: Secondary | ICD-10-CM | POA: Diagnosis not present

## 2018-01-16 DIAGNOSIS — J3089 Other allergic rhinitis: Secondary | ICD-10-CM | POA: Diagnosis not present

## 2018-01-22 DIAGNOSIS — J3089 Other allergic rhinitis: Secondary | ICD-10-CM | POA: Diagnosis not present

## 2018-01-26 DIAGNOSIS — H1045 Other chronic allergic conjunctivitis: Secondary | ICD-10-CM | POA: Diagnosis not present

## 2018-01-26 DIAGNOSIS — J3089 Other allergic rhinitis: Secondary | ICD-10-CM | POA: Diagnosis not present

## 2018-01-26 DIAGNOSIS — J4520 Mild intermittent asthma, uncomplicated: Secondary | ICD-10-CM | POA: Diagnosis not present

## 2018-01-29 DIAGNOSIS — J3089 Other allergic rhinitis: Secondary | ICD-10-CM | POA: Diagnosis not present

## 2018-02-05 DIAGNOSIS — J3089 Other allergic rhinitis: Secondary | ICD-10-CM | POA: Diagnosis not present

## 2018-02-09 DIAGNOSIS — Z23 Encounter for immunization: Secondary | ICD-10-CM | POA: Diagnosis not present

## 2018-02-09 DIAGNOSIS — Z0000 Encounter for general adult medical examination without abnormal findings: Secondary | ICD-10-CM | POA: Diagnosis not present

## 2018-02-09 DIAGNOSIS — E7800 Pure hypercholesterolemia, unspecified: Secondary | ICD-10-CM | POA: Diagnosis not present

## 2018-02-09 DIAGNOSIS — Z1159 Encounter for screening for other viral diseases: Secondary | ICD-10-CM | POA: Diagnosis not present

## 2018-02-09 DIAGNOSIS — Z853 Personal history of malignant neoplasm of breast: Secondary | ICD-10-CM | POA: Diagnosis not present

## 2018-02-09 DIAGNOSIS — E559 Vitamin D deficiency, unspecified: Secondary | ICD-10-CM | POA: Diagnosis not present

## 2018-02-12 DIAGNOSIS — J301 Allergic rhinitis due to pollen: Secondary | ICD-10-CM | POA: Diagnosis not present

## 2018-02-19 DIAGNOSIS — J3089 Other allergic rhinitis: Secondary | ICD-10-CM | POA: Diagnosis not present

## 2018-02-23 DIAGNOSIS — K080 Exfoliation of teeth due to systemic causes: Secondary | ICD-10-CM | POA: Diagnosis not present

## 2018-02-26 DIAGNOSIS — J3089 Other allergic rhinitis: Secondary | ICD-10-CM | POA: Diagnosis not present

## 2018-02-26 DIAGNOSIS — J301 Allergic rhinitis due to pollen: Secondary | ICD-10-CM | POA: Diagnosis not present

## 2018-03-13 DIAGNOSIS — J3089 Other allergic rhinitis: Secondary | ICD-10-CM | POA: Diagnosis not present

## 2018-03-19 DIAGNOSIS — J3089 Other allergic rhinitis: Secondary | ICD-10-CM | POA: Diagnosis not present

## 2018-03-26 DIAGNOSIS — J3089 Other allergic rhinitis: Secondary | ICD-10-CM | POA: Diagnosis not present

## 2018-03-30 DIAGNOSIS — Z1231 Encounter for screening mammogram for malignant neoplasm of breast: Secondary | ICD-10-CM | POA: Diagnosis not present

## 2018-04-01 DIAGNOSIS — K080 Exfoliation of teeth due to systemic causes: Secondary | ICD-10-CM | POA: Diagnosis not present

## 2018-04-01 DIAGNOSIS — J3089 Other allergic rhinitis: Secondary | ICD-10-CM | POA: Diagnosis not present

## 2018-04-03 DIAGNOSIS — Z86010 Personal history of colonic polyps: Secondary | ICD-10-CM | POA: Diagnosis not present

## 2018-04-03 DIAGNOSIS — K640 First degree hemorrhoids: Secondary | ICD-10-CM | POA: Diagnosis not present

## 2018-04-09 DIAGNOSIS — J3089 Other allergic rhinitis: Secondary | ICD-10-CM | POA: Diagnosis not present

## 2018-04-15 DIAGNOSIS — J3089 Other allergic rhinitis: Secondary | ICD-10-CM | POA: Diagnosis not present

## 2018-04-15 DIAGNOSIS — J301 Allergic rhinitis due to pollen: Secondary | ICD-10-CM | POA: Diagnosis not present

## 2018-04-21 DIAGNOSIS — J301 Allergic rhinitis due to pollen: Secondary | ICD-10-CM | POA: Diagnosis not present

## 2018-04-21 DIAGNOSIS — J3089 Other allergic rhinitis: Secondary | ICD-10-CM | POA: Diagnosis not present

## 2018-04-21 DIAGNOSIS — J3081 Allergic rhinitis due to animal (cat) (dog) hair and dander: Secondary | ICD-10-CM | POA: Diagnosis not present

## 2018-04-22 DIAGNOSIS — Z01419 Encounter for gynecological examination (general) (routine) without abnormal findings: Secondary | ICD-10-CM | POA: Diagnosis not present

## 2018-04-22 DIAGNOSIS — Z6826 Body mass index (BMI) 26.0-26.9, adult: Secondary | ICD-10-CM | POA: Diagnosis not present

## 2018-04-30 DIAGNOSIS — J3081 Allergic rhinitis due to animal (cat) (dog) hair and dander: Secondary | ICD-10-CM | POA: Diagnosis not present

## 2018-04-30 DIAGNOSIS — J301 Allergic rhinitis due to pollen: Secondary | ICD-10-CM | POA: Diagnosis not present

## 2018-04-30 DIAGNOSIS — J3089 Other allergic rhinitis: Secondary | ICD-10-CM | POA: Diagnosis not present

## 2018-05-20 DIAGNOSIS — J3089 Other allergic rhinitis: Secondary | ICD-10-CM | POA: Diagnosis not present

## 2018-05-27 DIAGNOSIS — J3089 Other allergic rhinitis: Secondary | ICD-10-CM | POA: Diagnosis not present

## 2018-06-03 DIAGNOSIS — J3089 Other allergic rhinitis: Secondary | ICD-10-CM | POA: Diagnosis not present

## 2018-06-08 DIAGNOSIS — J3089 Other allergic rhinitis: Secondary | ICD-10-CM | POA: Diagnosis not present

## 2018-06-10 DIAGNOSIS — J3089 Other allergic rhinitis: Secondary | ICD-10-CM | POA: Diagnosis not present

## 2018-06-17 DIAGNOSIS — J3089 Other allergic rhinitis: Secondary | ICD-10-CM | POA: Diagnosis not present

## 2018-06-24 DIAGNOSIS — J3089 Other allergic rhinitis: Secondary | ICD-10-CM | POA: Diagnosis not present

## 2018-07-01 DIAGNOSIS — J3089 Other allergic rhinitis: Secondary | ICD-10-CM | POA: Diagnosis not present

## 2018-07-15 DIAGNOSIS — M25571 Pain in right ankle and joints of right foot: Secondary | ICD-10-CM | POA: Diagnosis not present

## 2018-07-15 DIAGNOSIS — M25572 Pain in left ankle and joints of left foot: Secondary | ICD-10-CM | POA: Diagnosis not present

## 2018-07-21 DIAGNOSIS — C4361 Malignant melanoma of right upper limb, including shoulder: Secondary | ICD-10-CM | POA: Diagnosis not present

## 2018-07-21 DIAGNOSIS — D0361 Melanoma in situ of right upper limb, including shoulder: Secondary | ICD-10-CM | POA: Diagnosis not present

## 2018-07-22 DIAGNOSIS — J3089 Other allergic rhinitis: Secondary | ICD-10-CM | POA: Diagnosis not present

## 2018-07-29 DIAGNOSIS — J3089 Other allergic rhinitis: Secondary | ICD-10-CM | POA: Diagnosis not present

## 2018-08-07 DIAGNOSIS — J3089 Other allergic rhinitis: Secondary | ICD-10-CM | POA: Diagnosis not present

## 2018-08-10 DIAGNOSIS — J3089 Other allergic rhinitis: Secondary | ICD-10-CM | POA: Diagnosis not present

## 2018-08-14 DIAGNOSIS — M79671 Pain in right foot: Secondary | ICD-10-CM | POA: Diagnosis not present

## 2018-08-14 DIAGNOSIS — J3089 Other allergic rhinitis: Secondary | ICD-10-CM | POA: Diagnosis not present

## 2018-08-19 DIAGNOSIS — J3089 Other allergic rhinitis: Secondary | ICD-10-CM | POA: Diagnosis not present

## 2018-08-20 DIAGNOSIS — L905 Scar conditions and fibrosis of skin: Secondary | ICD-10-CM | POA: Diagnosis not present

## 2018-08-20 DIAGNOSIS — D0361 Melanoma in situ of right upper limb, including shoulder: Secondary | ICD-10-CM | POA: Diagnosis not present

## 2018-08-27 DIAGNOSIS — Z4802 Encounter for removal of sutures: Secondary | ICD-10-CM | POA: Diagnosis not present

## 2018-09-04 DIAGNOSIS — Z85820 Personal history of malignant melanoma of skin: Secondary | ICD-10-CM | POA: Diagnosis not present

## 2018-09-04 DIAGNOSIS — J3089 Other allergic rhinitis: Secondary | ICD-10-CM | POA: Diagnosis not present

## 2018-09-04 DIAGNOSIS — D224 Melanocytic nevi of scalp and neck: Secondary | ICD-10-CM | POA: Diagnosis not present

## 2018-09-04 DIAGNOSIS — L570 Actinic keratosis: Secondary | ICD-10-CM | POA: Diagnosis not present

## 2018-09-04 DIAGNOSIS — L821 Other seborrheic keratosis: Secondary | ICD-10-CM | POA: Diagnosis not present

## 2018-09-04 DIAGNOSIS — D1801 Hemangioma of skin and subcutaneous tissue: Secondary | ICD-10-CM | POA: Diagnosis not present

## 2018-09-10 DIAGNOSIS — J3089 Other allergic rhinitis: Secondary | ICD-10-CM | POA: Diagnosis not present

## 2018-09-17 DIAGNOSIS — J3089 Other allergic rhinitis: Secondary | ICD-10-CM | POA: Diagnosis not present

## 2018-09-23 DIAGNOSIS — H1045 Other chronic allergic conjunctivitis: Secondary | ICD-10-CM | POA: Diagnosis not present

## 2018-09-23 DIAGNOSIS — J3089 Other allergic rhinitis: Secondary | ICD-10-CM | POA: Diagnosis not present

## 2018-09-23 DIAGNOSIS — J4520 Mild intermittent asthma, uncomplicated: Secondary | ICD-10-CM | POA: Diagnosis not present

## 2018-09-25 DIAGNOSIS — J3089 Other allergic rhinitis: Secondary | ICD-10-CM | POA: Diagnosis not present

## 2018-10-01 DIAGNOSIS — J3089 Other allergic rhinitis: Secondary | ICD-10-CM | POA: Diagnosis not present

## 2018-10-07 DIAGNOSIS — K080 Exfoliation of teeth due to systemic causes: Secondary | ICD-10-CM | POA: Diagnosis not present

## 2018-10-08 DIAGNOSIS — J3089 Other allergic rhinitis: Secondary | ICD-10-CM | POA: Diagnosis not present

## 2018-10-14 DIAGNOSIS — D485 Neoplasm of uncertain behavior of skin: Secondary | ICD-10-CM | POA: Diagnosis not present

## 2018-10-14 DIAGNOSIS — L818 Other specified disorders of pigmentation: Secondary | ICD-10-CM | POA: Diagnosis not present

## 2018-10-15 DIAGNOSIS — J3089 Other allergic rhinitis: Secondary | ICD-10-CM | POA: Diagnosis not present

## 2018-10-16 DIAGNOSIS — H6123 Impacted cerumen, bilateral: Secondary | ICD-10-CM | POA: Diagnosis not present

## 2018-10-21 DIAGNOSIS — J3089 Other allergic rhinitis: Secondary | ICD-10-CM | POA: Diagnosis not present

## 2018-10-23 DIAGNOSIS — Z23 Encounter for immunization: Secondary | ICD-10-CM | POA: Diagnosis not present

## 2018-10-30 DIAGNOSIS — J3089 Other allergic rhinitis: Secondary | ICD-10-CM | POA: Diagnosis not present

## 2018-11-05 DIAGNOSIS — J3089 Other allergic rhinitis: Secondary | ICD-10-CM | POA: Diagnosis not present

## 2018-11-11 DIAGNOSIS — J3089 Other allergic rhinitis: Secondary | ICD-10-CM | POA: Diagnosis not present

## 2018-11-27 DIAGNOSIS — J3089 Other allergic rhinitis: Secondary | ICD-10-CM | POA: Diagnosis not present

## 2018-12-03 DIAGNOSIS — J3089 Other allergic rhinitis: Secondary | ICD-10-CM | POA: Diagnosis not present

## 2018-12-04 DIAGNOSIS — D2271 Melanocytic nevi of right lower limb, including hip: Secondary | ICD-10-CM | POA: Diagnosis not present

## 2018-12-04 DIAGNOSIS — L738 Other specified follicular disorders: Secondary | ICD-10-CM | POA: Diagnosis not present

## 2018-12-04 DIAGNOSIS — Z85820 Personal history of malignant melanoma of skin: Secondary | ICD-10-CM | POA: Diagnosis not present

## 2018-12-04 DIAGNOSIS — L905 Scar conditions and fibrosis of skin: Secondary | ICD-10-CM | POA: Diagnosis not present

## 2018-12-04 DIAGNOSIS — D485 Neoplasm of uncertain behavior of skin: Secondary | ICD-10-CM | POA: Diagnosis not present

## 2018-12-04 DIAGNOSIS — L821 Other seborrheic keratosis: Secondary | ICD-10-CM | POA: Diagnosis not present

## 2018-12-11 DIAGNOSIS — J3089 Other allergic rhinitis: Secondary | ICD-10-CM | POA: Diagnosis not present

## 2019-01-06 DIAGNOSIS — J3089 Other allergic rhinitis: Secondary | ICD-10-CM | POA: Diagnosis not present

## 2019-01-13 DIAGNOSIS — J3089 Other allergic rhinitis: Secondary | ICD-10-CM | POA: Diagnosis not present

## 2019-01-15 DIAGNOSIS — J3089 Other allergic rhinitis: Secondary | ICD-10-CM | POA: Diagnosis not present

## 2019-01-20 DIAGNOSIS — J3089 Other allergic rhinitis: Secondary | ICD-10-CM | POA: Diagnosis not present

## 2019-01-27 DIAGNOSIS — J3089 Other allergic rhinitis: Secondary | ICD-10-CM | POA: Diagnosis not present

## 2019-02-11 DIAGNOSIS — L853 Xerosis cutis: Secondary | ICD-10-CM | POA: Diagnosis not present

## 2019-02-11 DIAGNOSIS — Z853 Personal history of malignant neoplasm of breast: Secondary | ICD-10-CM | POA: Diagnosis not present

## 2019-02-11 DIAGNOSIS — Z0000 Encounter for general adult medical examination without abnormal findings: Secondary | ICD-10-CM | POA: Diagnosis not present

## 2019-02-11 DIAGNOSIS — E7800 Pure hypercholesterolemia, unspecified: Secondary | ICD-10-CM | POA: Diagnosis not present

## 2019-02-11 DIAGNOSIS — E559 Vitamin D deficiency, unspecified: Secondary | ICD-10-CM | POA: Diagnosis not present

## 2019-02-24 DIAGNOSIS — J3089 Other allergic rhinitis: Secondary | ICD-10-CM | POA: Diagnosis not present

## 2019-03-04 DIAGNOSIS — J3089 Other allergic rhinitis: Secondary | ICD-10-CM | POA: Diagnosis not present

## 2019-03-05 DIAGNOSIS — D2271 Melanocytic nevi of right lower limb, including hip: Secondary | ICD-10-CM | POA: Diagnosis not present

## 2019-03-05 DIAGNOSIS — D225 Melanocytic nevi of trunk: Secondary | ICD-10-CM | POA: Diagnosis not present

## 2019-03-05 DIAGNOSIS — D224 Melanocytic nevi of scalp and neck: Secondary | ICD-10-CM | POA: Diagnosis not present

## 2019-03-12 DIAGNOSIS — J3089 Other allergic rhinitis: Secondary | ICD-10-CM | POA: Diagnosis not present

## 2019-03-19 DIAGNOSIS — J3089 Other allergic rhinitis: Secondary | ICD-10-CM | POA: Diagnosis not present

## 2019-03-26 DIAGNOSIS — J3089 Other allergic rhinitis: Secondary | ICD-10-CM | POA: Diagnosis not present

## 2019-04-01 DIAGNOSIS — J3089 Other allergic rhinitis: Secondary | ICD-10-CM | POA: Diagnosis not present

## 2019-04-08 DIAGNOSIS — K080 Exfoliation of teeth due to systemic causes: Secondary | ICD-10-CM | POA: Diagnosis not present

## 2019-04-08 DIAGNOSIS — J3089 Other allergic rhinitis: Secondary | ICD-10-CM | POA: Diagnosis not present

## 2019-04-15 DIAGNOSIS — J3089 Other allergic rhinitis: Secondary | ICD-10-CM | POA: Diagnosis not present

## 2019-04-22 DIAGNOSIS — J3089 Other allergic rhinitis: Secondary | ICD-10-CM | POA: Diagnosis not present

## 2019-04-28 DIAGNOSIS — Z01419 Encounter for gynecological examination (general) (routine) without abnormal findings: Secondary | ICD-10-CM | POA: Diagnosis not present

## 2019-04-28 DIAGNOSIS — Z6827 Body mass index (BMI) 27.0-27.9, adult: Secondary | ICD-10-CM | POA: Diagnosis not present

## 2019-04-29 DIAGNOSIS — J3089 Other allergic rhinitis: Secondary | ICD-10-CM | POA: Diagnosis not present

## 2019-05-06 DIAGNOSIS — J3089 Other allergic rhinitis: Secondary | ICD-10-CM | POA: Diagnosis not present

## 2019-05-13 DIAGNOSIS — J3089 Other allergic rhinitis: Secondary | ICD-10-CM | POA: Diagnosis not present

## 2019-05-20 DIAGNOSIS — J3089 Other allergic rhinitis: Secondary | ICD-10-CM | POA: Diagnosis not present

## 2019-05-27 DIAGNOSIS — J3089 Other allergic rhinitis: Secondary | ICD-10-CM | POA: Diagnosis not present

## 2019-06-01 DIAGNOSIS — J3089 Other allergic rhinitis: Secondary | ICD-10-CM | POA: Diagnosis not present

## 2019-06-03 DIAGNOSIS — J3089 Other allergic rhinitis: Secondary | ICD-10-CM | POA: Diagnosis not present

## 2019-06-10 DIAGNOSIS — J3089 Other allergic rhinitis: Secondary | ICD-10-CM | POA: Diagnosis not present

## 2019-06-17 DIAGNOSIS — J3089 Other allergic rhinitis: Secondary | ICD-10-CM | POA: Diagnosis not present

## 2019-06-24 DIAGNOSIS — J3089 Other allergic rhinitis: Secondary | ICD-10-CM | POA: Diagnosis not present

## 2019-07-01 DIAGNOSIS — J3089 Other allergic rhinitis: Secondary | ICD-10-CM | POA: Diagnosis not present

## 2019-07-07 DIAGNOSIS — Z853 Personal history of malignant neoplasm of breast: Secondary | ICD-10-CM | POA: Diagnosis not present

## 2019-07-07 DIAGNOSIS — Z1231 Encounter for screening mammogram for malignant neoplasm of breast: Secondary | ICD-10-CM | POA: Diagnosis not present

## 2019-07-08 DIAGNOSIS — J3089 Other allergic rhinitis: Secondary | ICD-10-CM | POA: Diagnosis not present

## 2019-07-16 DIAGNOSIS — J3089 Other allergic rhinitis: Secondary | ICD-10-CM | POA: Diagnosis not present

## 2019-07-22 DIAGNOSIS — J3089 Other allergic rhinitis: Secondary | ICD-10-CM | POA: Diagnosis not present

## 2019-07-29 DIAGNOSIS — J3089 Other allergic rhinitis: Secondary | ICD-10-CM | POA: Diagnosis not present

## 2019-08-02 DIAGNOSIS — K080 Exfoliation of teeth due to systemic causes: Secondary | ICD-10-CM | POA: Diagnosis not present

## 2019-08-05 DIAGNOSIS — J3089 Other allergic rhinitis: Secondary | ICD-10-CM | POA: Diagnosis not present

## 2019-08-12 DIAGNOSIS — J3089 Other allergic rhinitis: Secondary | ICD-10-CM | POA: Diagnosis not present

## 2019-08-19 DIAGNOSIS — J3089 Other allergic rhinitis: Secondary | ICD-10-CM | POA: Diagnosis not present

## 2019-08-24 DIAGNOSIS — D2271 Melanocytic nevi of right lower limb, including hip: Secondary | ICD-10-CM | POA: Diagnosis not present

## 2019-08-24 DIAGNOSIS — D225 Melanocytic nevi of trunk: Secondary | ICD-10-CM | POA: Diagnosis not present

## 2019-08-24 DIAGNOSIS — D2262 Melanocytic nevi of left upper limb, including shoulder: Secondary | ICD-10-CM | POA: Diagnosis not present

## 2019-08-24 DIAGNOSIS — K080 Exfoliation of teeth due to systemic causes: Secondary | ICD-10-CM | POA: Diagnosis not present

## 2019-08-24 DIAGNOSIS — L821 Other seborrheic keratosis: Secondary | ICD-10-CM | POA: Diagnosis not present

## 2019-08-26 DIAGNOSIS — J3089 Other allergic rhinitis: Secondary | ICD-10-CM | POA: Diagnosis not present

## 2019-09-03 DIAGNOSIS — J3089 Other allergic rhinitis: Secondary | ICD-10-CM | POA: Diagnosis not present

## 2019-09-07 DIAGNOSIS — H1045 Other chronic allergic conjunctivitis: Secondary | ICD-10-CM | POA: Diagnosis not present

## 2019-09-07 DIAGNOSIS — J4520 Mild intermittent asthma, uncomplicated: Secondary | ICD-10-CM | POA: Diagnosis not present

## 2019-09-07 DIAGNOSIS — J3089 Other allergic rhinitis: Secondary | ICD-10-CM | POA: Diagnosis not present

## 2019-09-09 DIAGNOSIS — J3089 Other allergic rhinitis: Secondary | ICD-10-CM | POA: Diagnosis not present

## 2019-09-16 DIAGNOSIS — J301 Allergic rhinitis due to pollen: Secondary | ICD-10-CM | POA: Diagnosis not present

## 2019-10-26 DIAGNOSIS — L821 Other seborrheic keratosis: Secondary | ICD-10-CM | POA: Diagnosis not present

## 2019-10-26 DIAGNOSIS — Z85820 Personal history of malignant melanoma of skin: Secondary | ICD-10-CM | POA: Diagnosis not present

## 2019-10-26 DIAGNOSIS — Z23 Encounter for immunization: Secondary | ICD-10-CM | POA: Diagnosis not present

## 2019-10-26 DIAGNOSIS — D225 Melanocytic nevi of trunk: Secondary | ICD-10-CM | POA: Diagnosis not present

## 2019-10-26 DIAGNOSIS — D1801 Hemangioma of skin and subcutaneous tissue: Secondary | ICD-10-CM | POA: Diagnosis not present

## 2019-10-26 DIAGNOSIS — J3089 Other allergic rhinitis: Secondary | ICD-10-CM | POA: Diagnosis not present

## 2019-10-28 DIAGNOSIS — J3089 Other allergic rhinitis: Secondary | ICD-10-CM | POA: Diagnosis not present

## 2019-11-04 DIAGNOSIS — J3089 Other allergic rhinitis: Secondary | ICD-10-CM | POA: Diagnosis not present

## 2019-11-11 DIAGNOSIS — J3089 Other allergic rhinitis: Secondary | ICD-10-CM | POA: Diagnosis not present

## 2019-11-24 DIAGNOSIS — C50911 Malignant neoplasm of unspecified site of right female breast: Secondary | ICD-10-CM | POA: Diagnosis not present

## 2019-12-02 DIAGNOSIS — J3089 Other allergic rhinitis: Secondary | ICD-10-CM | POA: Diagnosis not present

## 2019-12-09 DIAGNOSIS — J3089 Other allergic rhinitis: Secondary | ICD-10-CM | POA: Diagnosis not present

## 2019-12-15 DIAGNOSIS — J3089 Other allergic rhinitis: Secondary | ICD-10-CM | POA: Diagnosis not present

## 2019-12-23 DIAGNOSIS — K080 Exfoliation of teeth due to systemic causes: Secondary | ICD-10-CM | POA: Diagnosis not present

## 2019-12-23 DIAGNOSIS — J3089 Other allergic rhinitis: Secondary | ICD-10-CM | POA: Diagnosis not present

## 2020-01-04 DIAGNOSIS — J3089 Other allergic rhinitis: Secondary | ICD-10-CM | POA: Diagnosis not present

## 2020-01-10 DIAGNOSIS — J3089 Other allergic rhinitis: Secondary | ICD-10-CM | POA: Diagnosis not present

## 2020-01-19 DIAGNOSIS — J3089 Other allergic rhinitis: Secondary | ICD-10-CM | POA: Diagnosis not present

## 2020-01-26 DIAGNOSIS — J3089 Other allergic rhinitis: Secondary | ICD-10-CM | POA: Diagnosis not present

## 2020-02-09 DIAGNOSIS — J3089 Other allergic rhinitis: Secondary | ICD-10-CM | POA: Diagnosis not present

## 2020-02-16 DIAGNOSIS — J3089 Other allergic rhinitis: Secondary | ICD-10-CM | POA: Diagnosis not present

## 2020-02-16 DIAGNOSIS — E7800 Pure hypercholesterolemia, unspecified: Secondary | ICD-10-CM | POA: Diagnosis not present

## 2020-02-16 DIAGNOSIS — Z0000 Encounter for general adult medical examination without abnormal findings: Secondary | ICD-10-CM | POA: Diagnosis not present

## 2020-02-21 DIAGNOSIS — J3089 Other allergic rhinitis: Secondary | ICD-10-CM | POA: Diagnosis not present

## 2020-02-24 DIAGNOSIS — J3089 Other allergic rhinitis: Secondary | ICD-10-CM | POA: Diagnosis not present

## 2020-03-02 DIAGNOSIS — J3089 Other allergic rhinitis: Secondary | ICD-10-CM | POA: Diagnosis not present

## 2020-03-20 DIAGNOSIS — J3089 Other allergic rhinitis: Secondary | ICD-10-CM | POA: Diagnosis not present

## 2020-03-22 DIAGNOSIS — M7552 Bursitis of left shoulder: Secondary | ICD-10-CM | POA: Diagnosis not present

## 2020-03-22 DIAGNOSIS — E2839 Other primary ovarian failure: Secondary | ICD-10-CM | POA: Diagnosis not present

## 2020-03-22 DIAGNOSIS — R17 Unspecified jaundice: Secondary | ICD-10-CM | POA: Diagnosis not present

## 2020-03-22 DIAGNOSIS — H6121 Impacted cerumen, right ear: Secondary | ICD-10-CM | POA: Diagnosis not present

## 2020-03-28 DIAGNOSIS — J3089 Other allergic rhinitis: Secondary | ICD-10-CM | POA: Diagnosis not present

## 2020-04-04 DIAGNOSIS — J3089 Other allergic rhinitis: Secondary | ICD-10-CM | POA: Diagnosis not present

## 2020-04-11 DIAGNOSIS — J3089 Other allergic rhinitis: Secondary | ICD-10-CM | POA: Diagnosis not present

## 2020-04-18 DIAGNOSIS — J3089 Other allergic rhinitis: Secondary | ICD-10-CM | POA: Diagnosis not present

## 2020-04-24 DIAGNOSIS — D2262 Melanocytic nevi of left upper limb, including shoulder: Secondary | ICD-10-CM | POA: Diagnosis not present

## 2020-04-24 DIAGNOSIS — Z85820 Personal history of malignant melanoma of skin: Secondary | ICD-10-CM | POA: Diagnosis not present

## 2020-04-24 DIAGNOSIS — D2272 Melanocytic nevi of left lower limb, including hip: Secondary | ICD-10-CM | POA: Diagnosis not present

## 2020-04-24 DIAGNOSIS — D2271 Melanocytic nevi of right lower limb, including hip: Secondary | ICD-10-CM | POA: Diagnosis not present

## 2020-04-25 DIAGNOSIS — J3089 Other allergic rhinitis: Secondary | ICD-10-CM | POA: Diagnosis not present

## 2020-05-02 DIAGNOSIS — Z01419 Encounter for gynecological examination (general) (routine) without abnormal findings: Secondary | ICD-10-CM | POA: Diagnosis not present

## 2020-05-02 DIAGNOSIS — J3089 Other allergic rhinitis: Secondary | ICD-10-CM | POA: Diagnosis not present

## 2020-05-02 DIAGNOSIS — Z0142 Encounter for cervical smear to confirm findings of recent normal smear following initial abnormal smear: Secondary | ICD-10-CM | POA: Diagnosis not present

## 2020-05-09 DIAGNOSIS — J3089 Other allergic rhinitis: Secondary | ICD-10-CM | POA: Diagnosis not present

## 2020-05-16 DIAGNOSIS — J3089 Other allergic rhinitis: Secondary | ICD-10-CM | POA: Diagnosis not present

## 2020-05-23 DIAGNOSIS — J3089 Other allergic rhinitis: Secondary | ICD-10-CM | POA: Diagnosis not present

## 2020-05-30 DIAGNOSIS — J301 Allergic rhinitis due to pollen: Secondary | ICD-10-CM | POA: Diagnosis not present

## 2020-05-30 DIAGNOSIS — J3089 Other allergic rhinitis: Secondary | ICD-10-CM | POA: Diagnosis not present

## 2020-05-30 DIAGNOSIS — E7800 Pure hypercholesterolemia, unspecified: Secondary | ICD-10-CM | POA: Diagnosis not present

## 2020-06-06 DIAGNOSIS — J3089 Other allergic rhinitis: Secondary | ICD-10-CM | POA: Diagnosis not present

## 2020-06-13 DIAGNOSIS — J301 Allergic rhinitis due to pollen: Secondary | ICD-10-CM | POA: Diagnosis not present

## 2020-06-13 DIAGNOSIS — J3089 Other allergic rhinitis: Secondary | ICD-10-CM | POA: Diagnosis not present

## 2020-06-15 DIAGNOSIS — J3089 Other allergic rhinitis: Secondary | ICD-10-CM | POA: Diagnosis not present

## 2020-06-27 DIAGNOSIS — J3089 Other allergic rhinitis: Secondary | ICD-10-CM | POA: Diagnosis not present

## 2020-06-29 DIAGNOSIS — J3081 Allergic rhinitis due to animal (cat) (dog) hair and dander: Secondary | ICD-10-CM | POA: Diagnosis not present

## 2020-06-29 DIAGNOSIS — J3089 Other allergic rhinitis: Secondary | ICD-10-CM | POA: Diagnosis not present

## 2020-06-29 DIAGNOSIS — J301 Allergic rhinitis due to pollen: Secondary | ICD-10-CM | POA: Diagnosis not present

## 2020-07-04 DIAGNOSIS — J3089 Other allergic rhinitis: Secondary | ICD-10-CM | POA: Diagnosis not present

## 2020-07-11 DIAGNOSIS — J3089 Other allergic rhinitis: Secondary | ICD-10-CM | POA: Diagnosis not present

## 2020-07-12 DIAGNOSIS — Z780 Asymptomatic menopausal state: Secondary | ICD-10-CM | POA: Diagnosis not present

## 2020-07-12 DIAGNOSIS — Z13820 Encounter for screening for osteoporosis: Secondary | ICD-10-CM | POA: Diagnosis not present

## 2020-07-12 DIAGNOSIS — Z1231 Encounter for screening mammogram for malignant neoplasm of breast: Secondary | ICD-10-CM | POA: Diagnosis not present

## 2020-07-18 DIAGNOSIS — J3089 Other allergic rhinitis: Secondary | ICD-10-CM | POA: Diagnosis not present

## 2020-08-03 DIAGNOSIS — J3089 Other allergic rhinitis: Secondary | ICD-10-CM | POA: Diagnosis not present

## 2020-08-09 DIAGNOSIS — J3089 Other allergic rhinitis: Secondary | ICD-10-CM | POA: Diagnosis not present

## 2020-08-15 DIAGNOSIS — J3089 Other allergic rhinitis: Secondary | ICD-10-CM | POA: Diagnosis not present

## 2020-08-22 DIAGNOSIS — J3089 Other allergic rhinitis: Secondary | ICD-10-CM | POA: Diagnosis not present

## 2020-08-23 DIAGNOSIS — C50911 Malignant neoplasm of unspecified site of right female breast: Secondary | ICD-10-CM | POA: Diagnosis not present

## 2020-08-29 DIAGNOSIS — J3089 Other allergic rhinitis: Secondary | ICD-10-CM | POA: Diagnosis not present

## 2020-09-05 DIAGNOSIS — J3089 Other allergic rhinitis: Secondary | ICD-10-CM | POA: Diagnosis not present

## 2020-09-07 DIAGNOSIS — C50911 Malignant neoplasm of unspecified site of right female breast: Secondary | ICD-10-CM | POA: Diagnosis not present

## 2020-09-08 DIAGNOSIS — J4520 Mild intermittent asthma, uncomplicated: Secondary | ICD-10-CM | POA: Diagnosis not present

## 2020-09-08 DIAGNOSIS — J3089 Other allergic rhinitis: Secondary | ICD-10-CM | POA: Diagnosis not present

## 2020-09-08 DIAGNOSIS — H1045 Other chronic allergic conjunctivitis: Secondary | ICD-10-CM | POA: Diagnosis not present

## 2020-09-15 DIAGNOSIS — M7741 Metatarsalgia, right foot: Secondary | ICD-10-CM | POA: Diagnosis not present

## 2020-09-15 DIAGNOSIS — D1724 Benign lipomatous neoplasm of skin and subcutaneous tissue of left leg: Secondary | ICD-10-CM | POA: Diagnosis not present

## 2020-09-15 DIAGNOSIS — R2231 Localized swelling, mass and lump, right upper limb: Secondary | ICD-10-CM | POA: Diagnosis not present

## 2020-09-15 DIAGNOSIS — J3089 Other allergic rhinitis: Secondary | ICD-10-CM | POA: Diagnosis not present

## 2020-09-15 DIAGNOSIS — M25511 Pain in right shoulder: Secondary | ICD-10-CM | POA: Diagnosis not present

## 2020-09-20 DIAGNOSIS — J3089 Other allergic rhinitis: Secondary | ICD-10-CM | POA: Diagnosis not present

## 2020-09-21 DIAGNOSIS — D485 Neoplasm of uncertain behavior of skin: Secondary | ICD-10-CM | POA: Diagnosis not present

## 2020-09-21 DIAGNOSIS — D1724 Benign lipomatous neoplasm of skin and subcutaneous tissue of left leg: Secondary | ICD-10-CM | POA: Diagnosis not present

## 2020-09-21 DIAGNOSIS — D3612 Benign neoplasm of peripheral nerves and autonomic nervous system, upper limb, including shoulder: Secondary | ICD-10-CM | POA: Diagnosis not present

## 2020-09-26 DIAGNOSIS — J3089 Other allergic rhinitis: Secondary | ICD-10-CM | POA: Diagnosis not present

## 2020-10-13 DIAGNOSIS — J3089 Other allergic rhinitis: Secondary | ICD-10-CM | POA: Diagnosis not present

## 2020-10-17 DIAGNOSIS — J3089 Other allergic rhinitis: Secondary | ICD-10-CM | POA: Diagnosis not present

## 2020-10-19 DIAGNOSIS — Z23 Encounter for immunization: Secondary | ICD-10-CM | POA: Diagnosis not present

## 2020-10-26 DIAGNOSIS — J301 Allergic rhinitis due to pollen: Secondary | ICD-10-CM | POA: Diagnosis not present

## 2020-10-26 DIAGNOSIS — J3081 Allergic rhinitis due to animal (cat) (dog) hair and dander: Secondary | ICD-10-CM | POA: Diagnosis not present

## 2020-10-26 DIAGNOSIS — J3089 Other allergic rhinitis: Secondary | ICD-10-CM | POA: Diagnosis not present

## 2020-11-02 DIAGNOSIS — J3089 Other allergic rhinitis: Secondary | ICD-10-CM | POA: Diagnosis not present

## 2020-11-14 DIAGNOSIS — J3089 Other allergic rhinitis: Secondary | ICD-10-CM | POA: Diagnosis not present

## 2020-11-20 DIAGNOSIS — J3089 Other allergic rhinitis: Secondary | ICD-10-CM | POA: Diagnosis not present

## 2020-11-21 DIAGNOSIS — J3089 Other allergic rhinitis: Secondary | ICD-10-CM | POA: Diagnosis not present

## 2020-11-28 DIAGNOSIS — J3089 Other allergic rhinitis: Secondary | ICD-10-CM | POA: Diagnosis not present

## 2020-12-05 DIAGNOSIS — J3089 Other allergic rhinitis: Secondary | ICD-10-CM | POA: Diagnosis not present

## 2020-12-05 DIAGNOSIS — E7800 Pure hypercholesterolemia, unspecified: Secondary | ICD-10-CM | POA: Diagnosis not present

## 2020-12-12 DIAGNOSIS — J3089 Other allergic rhinitis: Secondary | ICD-10-CM | POA: Diagnosis not present

## 2020-12-19 DIAGNOSIS — J3089 Other allergic rhinitis: Secondary | ICD-10-CM | POA: Diagnosis not present

## 2020-12-25 DIAGNOSIS — L738 Other specified follicular disorders: Secondary | ICD-10-CM | POA: Diagnosis not present

## 2020-12-25 DIAGNOSIS — L821 Other seborrheic keratosis: Secondary | ICD-10-CM | POA: Diagnosis not present

## 2020-12-25 DIAGNOSIS — D2261 Melanocytic nevi of right upper limb, including shoulder: Secondary | ICD-10-CM | POA: Diagnosis not present

## 2020-12-25 DIAGNOSIS — D225 Melanocytic nevi of trunk: Secondary | ICD-10-CM | POA: Diagnosis not present

## 2020-12-27 DIAGNOSIS — J3089 Other allergic rhinitis: Secondary | ICD-10-CM | POA: Diagnosis not present

## 2021-03-30 DIAGNOSIS — E7800 Pure hypercholesterolemia, unspecified: Secondary | ICD-10-CM | POA: Diagnosis not present

## 2021-03-30 DIAGNOSIS — Z0000 Encounter for general adult medical examination without abnormal findings: Secondary | ICD-10-CM | POA: Diagnosis not present

## 2021-04-03 DIAGNOSIS — C50911 Malignant neoplasm of unspecified site of right female breast: Secondary | ICD-10-CM | POA: Diagnosis not present

## 2021-05-01 DIAGNOSIS — M2142 Flat foot [pes planus] (acquired), left foot: Secondary | ICD-10-CM | POA: Diagnosis not present

## 2021-05-01 DIAGNOSIS — M2141 Flat foot [pes planus] (acquired), right foot: Secondary | ICD-10-CM | POA: Diagnosis not present

## 2021-05-01 DIAGNOSIS — M25512 Pain in left shoulder: Secondary | ICD-10-CM | POA: Diagnosis not present

## 2021-05-02 DIAGNOSIS — C50911 Malignant neoplasm of unspecified site of right female breast: Secondary | ICD-10-CM | POA: Diagnosis not present

## 2021-05-07 ENCOUNTER — Other Ambulatory Visit: Payer: Self-pay

## 2021-05-07 ENCOUNTER — Ambulatory Visit: Payer: Federal, State, Local not specified - PPO | Admitting: Family Medicine

## 2021-05-07 ENCOUNTER — Encounter: Payer: Self-pay | Admitting: Family Medicine

## 2021-05-07 VITALS — BP 100/66 | Ht 61.5 in | Wt 133.0 lb

## 2021-05-07 DIAGNOSIS — M21612 Bunion of left foot: Secondary | ICD-10-CM | POA: Diagnosis not present

## 2021-05-07 DIAGNOSIS — R269 Unspecified abnormalities of gait and mobility: Secondary | ICD-10-CM

## 2021-05-07 DIAGNOSIS — M2141 Flat foot [pes planus] (acquired), right foot: Secondary | ICD-10-CM

## 2021-05-07 DIAGNOSIS — M2142 Flat foot [pes planus] (acquired), left foot: Secondary | ICD-10-CM

## 2021-05-07 NOTE — Progress Notes (Signed)
PCP: Glenis Smoker, MD  Subjective:   HPI: Patient is a 70 y.o. female here for orthotics.  She has a history of pes planus and has had orthotics in the past. Most recently had some OTC orthotics 2 months prior which are starting to become less supportive and more thin. She hikes often. Has trouble with left foot bunion that feels like pins and needles and burning. Left foot bothers her more than the right. Also had leg length discrepancy which she states right leg is about 1/8 inch longer than her left.   Past Medical History:  Diagnosis Date  . Breast cancer ([place])   . Hyperlipidemia     Current Outpatient Medications on File Prior to Visit  Medication Sig Dispense Refill  . Calcium Carbonate-Vit D-Min (CALCIUM 1200 PO) Take 1,200 mg by mouth daily.    Marland Kitchen guaiFENesin (MUCINEX) 600 MG 12 hr tablet Take 1,200 mg by mouth 2 (two) times daily as needed.    Marland Kitchen ketotifen (ALAWAY) 0.025 % ophthalmic solution Place 1 drop into both eyes 2 (two) times daily as needed.    . loratadine (CLARITIN) 10 MG tablet Take 10 mg by mouth daily as needed for allergies.    . Multiple Vitamin (MULTIVITAMIN) tablet Take 1 tablet by mouth daily.    Vladimir Faster Glycol-Propyl Glycol (SYSTANE) 0.4-0.3 % SOLN Apply to eye as needed. Dry eyes    . VITAMIN D, ERGOCALCIFEROL, PO Take 2,800 Units by mouth daily.     No current facility-administered medications on file prior to visit.    Allergies  Allergen Reactions  . Adhesive [Tape]   . Amoxicillin   . Azithromycin   . Clarithromycin   . Doxycycline   . Latex   . Levsin [Hyoscyamine]   . Meperidine And Related   . Sulfa Antibiotics     Social History   Socioeconomic History  . Marital status: Married    Spouse name: Not on file  . Number of children: Not on file  . Years of education: Not on file  . Highest education level: Not on file  Occupational History  . Not on file  Tobacco Use  . Smoking status: Not on file  . Smokeless tobacco:  Not on file  Substance and Sexual Activity  . Alcohol use: Not on file  . Drug use: Not on file  . Sexual activity: Not on file  Other Topics Concern  . Not on file  Social History Narrative  . Not on file   Social Determinants of Health   Financial Resource Strain: Not on file  Food Insecurity: Not on file  Transportation Needs: Not on file  Physical Activity: Not on file  Stress: Not on file  Social Connections: Not on file  Intimate Partner Violence: Not on file    No family history on file.  BP 100/66   Ht 5' 1.5" (1.562 m)   Wt 133 lb (60.3 kg)   BMI 24.72 kg/m   No flowsheet data found.  No flowsheet data found.  Review of Systems: See HPI above.     Objective:  Physical Exam:  Gen: NAD, comfortable in exam room  Left foot/ankle: Bunion.  Hallux valgus and rigidus.  No transverse arch collapse otherwise.  No other gross deformity, swelling, ecchymoses FROM No TTP Negative ant drawer and negative talar tilt.   Thompsons test negative. NV intact distally.  Right foot/ankle: Hallux rigidus.  No transverse arch collapse.  No other gross deformity, swelling, ecchymoses  FROM No TTP Negative ant drawer and negative talar tilt.   Thompsons test negative. NV intact distally.  Minimal leg length discrepancy with right longer than left. Right SI joint with minimal mobility on faber compared to left.   Assessment & Plan:  1. Pes planus with left bunion.  Hallux rigidus.  New custom orthotics made today and were comfortable to the patient.  Reviewed right SI joint stretches.  Toe spacer, bunion shield, shoes with wide toebox.  Patient was fitted for a : standard, cushioned, semi-rigid orthotic. The orthotic was heated and afterward the patient stood on the orthotic blank positioned on the orthotic stand. The patient was positioned in subtalar neutral position and 10 degrees of ankle dorsiflexion in a weight bearing stance. After completion of molding, a  stable base was applied to the orthotic blank. The blank was ground to a stable position for weight bearing. Size: 9  Base: blue med density eva Posting: none Additional orthotic padding: none

## 2021-05-09 DIAGNOSIS — Z01419 Encounter for gynecological examination (general) (routine) without abnormal findings: Secondary | ICD-10-CM | POA: Diagnosis not present

## 2021-05-09 DIAGNOSIS — Z6826 Body mass index (BMI) 26.0-26.9, adult: Secondary | ICD-10-CM | POA: Diagnosis not present

## 2021-06-12 DIAGNOSIS — M25512 Pain in left shoulder: Secondary | ICD-10-CM | POA: Diagnosis not present

## 2021-06-14 DIAGNOSIS — R87615 Unsatisfactory cytologic smear of cervix: Secondary | ICD-10-CM | POA: Diagnosis not present

## 2021-06-22 DIAGNOSIS — J3089 Other allergic rhinitis: Secondary | ICD-10-CM | POA: Diagnosis not present

## 2021-06-27 DIAGNOSIS — J3089 Other allergic rhinitis: Secondary | ICD-10-CM | POA: Diagnosis not present

## 2021-06-29 DIAGNOSIS — J3089 Other allergic rhinitis: Secondary | ICD-10-CM | POA: Diagnosis not present

## 2021-07-03 DIAGNOSIS — J3089 Other allergic rhinitis: Secondary | ICD-10-CM | POA: Diagnosis not present

## 2021-07-06 DIAGNOSIS — J3089 Other allergic rhinitis: Secondary | ICD-10-CM | POA: Diagnosis not present

## 2021-07-06 DIAGNOSIS — J301 Allergic rhinitis due to pollen: Secondary | ICD-10-CM | POA: Diagnosis not present

## 2021-07-10 DIAGNOSIS — J3089 Other allergic rhinitis: Secondary | ICD-10-CM | POA: Diagnosis not present

## 2021-07-12 DIAGNOSIS — J3089 Other allergic rhinitis: Secondary | ICD-10-CM | POA: Diagnosis not present

## 2021-07-18 DIAGNOSIS — Z1231 Encounter for screening mammogram for malignant neoplasm of breast: Secondary | ICD-10-CM | POA: Diagnosis not present

## 2021-07-18 DIAGNOSIS — J3089 Other allergic rhinitis: Secondary | ICD-10-CM | POA: Diagnosis not present

## 2021-07-22 DIAGNOSIS — H5789 Other specified disorders of eye and adnexa: Secondary | ICD-10-CM | POA: Diagnosis not present

## 2021-07-22 DIAGNOSIS — S0501XA Injury of conjunctiva and corneal abrasion without foreign body, right eye, initial encounter: Secondary | ICD-10-CM | POA: Diagnosis not present

## 2021-07-24 DIAGNOSIS — J3089 Other allergic rhinitis: Secondary | ICD-10-CM | POA: Diagnosis not present

## 2021-07-31 DIAGNOSIS — J3089 Other allergic rhinitis: Secondary | ICD-10-CM | POA: Diagnosis not present

## 2021-08-07 DIAGNOSIS — J3089 Other allergic rhinitis: Secondary | ICD-10-CM | POA: Diagnosis not present

## 2021-08-14 DIAGNOSIS — J3089 Other allergic rhinitis: Secondary | ICD-10-CM | POA: Diagnosis not present

## 2021-08-21 DIAGNOSIS — J3089 Other allergic rhinitis: Secondary | ICD-10-CM | POA: Diagnosis not present

## 2021-08-28 DIAGNOSIS — J3089 Other allergic rhinitis: Secondary | ICD-10-CM | POA: Diagnosis not present

## 2021-09-04 DIAGNOSIS — D2262 Melanocytic nevi of left upper limb, including shoulder: Secondary | ICD-10-CM | POA: Diagnosis not present

## 2021-09-04 DIAGNOSIS — D225 Melanocytic nevi of trunk: Secondary | ICD-10-CM | POA: Diagnosis not present

## 2021-09-04 DIAGNOSIS — J3089 Other allergic rhinitis: Secondary | ICD-10-CM | POA: Diagnosis not present

## 2021-09-04 DIAGNOSIS — Z85820 Personal history of malignant melanoma of skin: Secondary | ICD-10-CM | POA: Diagnosis not present

## 2021-09-04 DIAGNOSIS — D2272 Melanocytic nevi of left lower limb, including hip: Secondary | ICD-10-CM | POA: Diagnosis not present

## 2021-09-25 DIAGNOSIS — J3089 Other allergic rhinitis: Secondary | ICD-10-CM | POA: Diagnosis not present

## 2021-10-02 DIAGNOSIS — J3089 Other allergic rhinitis: Secondary | ICD-10-CM | POA: Diagnosis not present

## 2021-10-09 DIAGNOSIS — J301 Allergic rhinitis due to pollen: Secondary | ICD-10-CM | POA: Diagnosis not present

## 2021-10-09 DIAGNOSIS — J3089 Other allergic rhinitis: Secondary | ICD-10-CM | POA: Diagnosis not present

## 2021-10-10 DIAGNOSIS — J4520 Mild intermittent asthma, uncomplicated: Secondary | ICD-10-CM | POA: Diagnosis not present

## 2021-10-10 DIAGNOSIS — J3089 Other allergic rhinitis: Secondary | ICD-10-CM | POA: Diagnosis not present

## 2021-10-10 DIAGNOSIS — H1045 Other chronic allergic conjunctivitis: Secondary | ICD-10-CM | POA: Diagnosis not present

## 2021-10-16 DIAGNOSIS — J3089 Other allergic rhinitis: Secondary | ICD-10-CM | POA: Diagnosis not present

## 2021-10-23 DIAGNOSIS — J3089 Other allergic rhinitis: Secondary | ICD-10-CM | POA: Diagnosis not present

## 2021-10-30 DIAGNOSIS — J3089 Other allergic rhinitis: Secondary | ICD-10-CM | POA: Diagnosis not present

## 2021-11-06 DIAGNOSIS — C50911 Malignant neoplasm of unspecified site of right female breast: Secondary | ICD-10-CM | POA: Diagnosis not present

## 2021-11-20 DIAGNOSIS — J3089 Other allergic rhinitis: Secondary | ICD-10-CM | POA: Diagnosis not present

## 2021-12-05 DIAGNOSIS — K080 Exfoliation of teeth due to systemic causes: Secondary | ICD-10-CM | POA: Diagnosis not present

## 2021-12-05 DIAGNOSIS — J3089 Other allergic rhinitis: Secondary | ICD-10-CM | POA: Diagnosis not present

## 2021-12-11 DIAGNOSIS — J3089 Other allergic rhinitis: Secondary | ICD-10-CM | POA: Diagnosis not present

## 2021-12-25 DIAGNOSIS — J3089 Other allergic rhinitis: Secondary | ICD-10-CM | POA: Diagnosis not present

## 2022-01-01 DIAGNOSIS — J3089 Other allergic rhinitis: Secondary | ICD-10-CM | POA: Diagnosis not present

## 2022-01-15 DIAGNOSIS — J3089 Other allergic rhinitis: Secondary | ICD-10-CM | POA: Diagnosis not present

## 2022-01-22 DIAGNOSIS — J3089 Other allergic rhinitis: Secondary | ICD-10-CM | POA: Diagnosis not present

## 2022-01-23 DIAGNOSIS — J3089 Other allergic rhinitis: Secondary | ICD-10-CM | POA: Diagnosis not present

## 2022-01-30 DIAGNOSIS — J3089 Other allergic rhinitis: Secondary | ICD-10-CM | POA: Diagnosis not present

## 2022-02-06 DIAGNOSIS — J3089 Other allergic rhinitis: Secondary | ICD-10-CM | POA: Diagnosis not present

## 2022-02-12 DIAGNOSIS — J3089 Other allergic rhinitis: Secondary | ICD-10-CM | POA: Diagnosis not present

## 2022-02-19 DIAGNOSIS — J3089 Other allergic rhinitis: Secondary | ICD-10-CM | POA: Diagnosis not present

## 2022-02-20 DIAGNOSIS — D2262 Melanocytic nevi of left upper limb, including shoulder: Secondary | ICD-10-CM | POA: Diagnosis not present

## 2022-02-20 DIAGNOSIS — D225 Melanocytic nevi of trunk: Secondary | ICD-10-CM | POA: Diagnosis not present

## 2022-02-20 DIAGNOSIS — D485 Neoplasm of uncertain behavior of skin: Secondary | ICD-10-CM | POA: Diagnosis not present

## 2022-02-20 DIAGNOSIS — D1801 Hemangioma of skin and subcutaneous tissue: Secondary | ICD-10-CM | POA: Diagnosis not present

## 2022-02-20 DIAGNOSIS — L814 Other melanin hyperpigmentation: Secondary | ICD-10-CM | POA: Diagnosis not present

## 2022-02-27 DIAGNOSIS — J3089 Other allergic rhinitis: Secondary | ICD-10-CM | POA: Diagnosis not present

## 2022-03-05 DIAGNOSIS — J301 Allergic rhinitis due to pollen: Secondary | ICD-10-CM | POA: Diagnosis not present

## 2022-03-05 DIAGNOSIS — J3089 Other allergic rhinitis: Secondary | ICD-10-CM | POA: Diagnosis not present

## 2022-03-05 DIAGNOSIS — J3081 Allergic rhinitis due to animal (cat) (dog) hair and dander: Secondary | ICD-10-CM | POA: Diagnosis not present

## 2022-03-12 DIAGNOSIS — J3089 Other allergic rhinitis: Secondary | ICD-10-CM | POA: Diagnosis not present

## 2022-03-19 DIAGNOSIS — J3089 Other allergic rhinitis: Secondary | ICD-10-CM | POA: Diagnosis not present

## 2022-03-25 DIAGNOSIS — D487 Neoplasm of uncertain behavior of other specified sites: Secondary | ICD-10-CM | POA: Diagnosis not present

## 2022-03-25 DIAGNOSIS — D485 Neoplasm of uncertain behavior of skin: Secondary | ICD-10-CM | POA: Diagnosis not present

## 2022-04-04 DIAGNOSIS — J3089 Other allergic rhinitis: Secondary | ICD-10-CM | POA: Diagnosis not present

## 2022-04-10 DIAGNOSIS — J3089 Other allergic rhinitis: Secondary | ICD-10-CM | POA: Diagnosis not present

## 2022-04-16 DIAGNOSIS — J3089 Other allergic rhinitis: Secondary | ICD-10-CM | POA: Diagnosis not present

## 2022-04-24 DIAGNOSIS — E7800 Pure hypercholesterolemia, unspecified: Secondary | ICD-10-CM | POA: Diagnosis not present

## 2022-04-24 DIAGNOSIS — J3089 Other allergic rhinitis: Secondary | ICD-10-CM | POA: Diagnosis not present

## 2022-04-24 DIAGNOSIS — Z79899 Other long term (current) drug therapy: Secondary | ICD-10-CM | POA: Diagnosis not present

## 2022-04-24 DIAGNOSIS — Z0000 Encounter for general adult medical examination without abnormal findings: Secondary | ICD-10-CM | POA: Diagnosis not present

## 2022-05-01 DIAGNOSIS — J301 Allergic rhinitis due to pollen: Secondary | ICD-10-CM | POA: Diagnosis not present

## 2022-05-01 DIAGNOSIS — J3081 Allergic rhinitis due to animal (cat) (dog) hair and dander: Secondary | ICD-10-CM | POA: Diagnosis not present

## 2022-05-01 DIAGNOSIS — J3089 Other allergic rhinitis: Secondary | ICD-10-CM | POA: Diagnosis not present

## 2022-05-07 DIAGNOSIS — J3089 Other allergic rhinitis: Secondary | ICD-10-CM | POA: Diagnosis not present

## 2022-05-07 DIAGNOSIS — J301 Allergic rhinitis due to pollen: Secondary | ICD-10-CM | POA: Diagnosis not present

## 2022-05-07 DIAGNOSIS — J3081 Allergic rhinitis due to animal (cat) (dog) hair and dander: Secondary | ICD-10-CM | POA: Diagnosis not present

## 2022-05-08 ENCOUNTER — Other Ambulatory Visit (HOSPITAL_COMMUNITY): Payer: Self-pay | Admitting: Family Medicine

## 2022-05-08 ENCOUNTER — Other Ambulatory Visit: Payer: Self-pay | Admitting: Family Medicine

## 2022-05-08 DIAGNOSIS — E7800 Pure hypercholesterolemia, unspecified: Secondary | ICD-10-CM

## 2022-05-10 ENCOUNTER — Ambulatory Visit (HOSPITAL_COMMUNITY)
Admission: RE | Admit: 2022-05-10 | Discharge: 2022-05-10 | Disposition: A | Discharge status: Home / Self Care | Payer: Self-pay | Source: Ambulatory Visit | Attending: Family Medicine | Admitting: Family Medicine

## 2022-05-10 DIAGNOSIS — E7800 Pure hypercholesterolemia, unspecified: Secondary | ICD-10-CM | POA: Insufficient documentation

## 2022-05-14 DIAGNOSIS — J3081 Allergic rhinitis due to animal (cat) (dog) hair and dander: Secondary | ICD-10-CM | POA: Diagnosis not present

## 2022-05-14 DIAGNOSIS — J3089 Other allergic rhinitis: Secondary | ICD-10-CM | POA: Diagnosis not present

## 2022-05-14 DIAGNOSIS — J301 Allergic rhinitis due to pollen: Secondary | ICD-10-CM | POA: Diagnosis not present

## 2022-05-15 ENCOUNTER — Other Ambulatory Visit (HOSPITAL_BASED_OUTPATIENT_CLINIC_OR_DEPARTMENT_OTHER): Payer: Self-pay | Admitting: Family Medicine

## 2022-05-15 DIAGNOSIS — Z9889 Other specified postprocedural states: Secondary | ICD-10-CM

## 2022-05-15 DIAGNOSIS — R1031 Right lower quadrant pain: Secondary | ICD-10-CM | POA: Diagnosis not present

## 2022-05-15 DIAGNOSIS — R1030 Lower abdominal pain, unspecified: Secondary | ICD-10-CM | POA: Diagnosis not present

## 2022-05-16 ENCOUNTER — Encounter (HOSPITAL_BASED_OUTPATIENT_CLINIC_OR_DEPARTMENT_OTHER): Payer: Self-pay

## 2022-05-16 ENCOUNTER — Ambulatory Visit (HOSPITAL_BASED_OUTPATIENT_CLINIC_OR_DEPARTMENT_OTHER)
Admission: RE | Admit: 2022-05-16 | Discharge: 2022-05-16 | Disposition: A | Discharge status: Home / Self Care | Payer: Federal, State, Local not specified - PPO | Source: Ambulatory Visit | Attending: Family Medicine | Admitting: Family Medicine

## 2022-05-16 DIAGNOSIS — R1031 Right lower quadrant pain: Secondary | ICD-10-CM | POA: Insufficient documentation

## 2022-05-16 DIAGNOSIS — Z9889 Other specified postprocedural states: Secondary | ICD-10-CM | POA: Insufficient documentation

## 2022-05-16 MED ORDER — IOHEXOL 300 MG/ML  SOLN
75.0000 mL | Freq: Once | INTRAMUSCULAR | Status: AC | PRN
  Administered 2022-05-16: 75 mL via INTRAVENOUS

## 2022-05-21 DIAGNOSIS — J3081 Allergic rhinitis due to animal (cat) (dog) hair and dander: Secondary | ICD-10-CM | POA: Diagnosis not present

## 2022-05-21 DIAGNOSIS — J301 Allergic rhinitis due to pollen: Secondary | ICD-10-CM | POA: Diagnosis not present

## 2022-05-21 DIAGNOSIS — J3089 Other allergic rhinitis: Secondary | ICD-10-CM | POA: Diagnosis not present

## 2022-05-29 DIAGNOSIS — J3089 Other allergic rhinitis: Secondary | ICD-10-CM | POA: Diagnosis not present

## 2022-06-03 DIAGNOSIS — R350 Frequency of micturition: Secondary | ICD-10-CM | POA: Diagnosis not present

## 2022-06-03 DIAGNOSIS — K8689 Other specified diseases of pancreas: Secondary | ICD-10-CM | POA: Diagnosis not present

## 2022-06-03 DIAGNOSIS — E7800 Pure hypercholesterolemia, unspecified: Secondary | ICD-10-CM | POA: Diagnosis not present

## 2022-06-04 DIAGNOSIS — J301 Allergic rhinitis due to pollen: Secondary | ICD-10-CM | POA: Diagnosis not present

## 2022-06-04 DIAGNOSIS — K8689 Other specified diseases of pancreas: Secondary | ICD-10-CM | POA: Diagnosis not present

## 2022-06-04 DIAGNOSIS — J3081 Allergic rhinitis due to animal (cat) (dog) hair and dander: Secondary | ICD-10-CM | POA: Diagnosis not present

## 2022-06-04 DIAGNOSIS — J3089 Other allergic rhinitis: Secondary | ICD-10-CM | POA: Diagnosis not present

## 2022-06-11 DIAGNOSIS — J3081 Allergic rhinitis due to animal (cat) (dog) hair and dander: Secondary | ICD-10-CM | POA: Diagnosis not present

## 2022-06-11 DIAGNOSIS — J301 Allergic rhinitis due to pollen: Secondary | ICD-10-CM | POA: Diagnosis not present

## 2022-06-11 DIAGNOSIS — Z01419 Encounter for gynecological examination (general) (routine) without abnormal findings: Secondary | ICD-10-CM | POA: Diagnosis not present

## 2022-06-11 DIAGNOSIS — J3089 Other allergic rhinitis: Secondary | ICD-10-CM | POA: Diagnosis not present

## 2022-06-11 DIAGNOSIS — Z124 Encounter for screening for malignant neoplasm of cervix: Secondary | ICD-10-CM | POA: Diagnosis not present

## 2022-06-18 DIAGNOSIS — J3081 Allergic rhinitis due to animal (cat) (dog) hair and dander: Secondary | ICD-10-CM | POA: Diagnosis not present

## 2022-06-18 DIAGNOSIS — J3089 Other allergic rhinitis: Secondary | ICD-10-CM | POA: Diagnosis not present

## 2022-06-18 DIAGNOSIS — J301 Allergic rhinitis due to pollen: Secondary | ICD-10-CM | POA: Diagnosis not present

## 2022-06-19 DIAGNOSIS — H2512 Age-related nuclear cataract, left eye: Secondary | ICD-10-CM | POA: Diagnosis not present

## 2022-06-19 DIAGNOSIS — Z961 Presence of intraocular lens: Secondary | ICD-10-CM | POA: Diagnosis not present

## 2022-06-19 DIAGNOSIS — H04123 Dry eye syndrome of bilateral lacrimal glands: Secondary | ICD-10-CM | POA: Diagnosis not present

## 2022-07-02 DIAGNOSIS — J301 Allergic rhinitis due to pollen: Secondary | ICD-10-CM | POA: Diagnosis not present

## 2022-07-02 DIAGNOSIS — J3081 Allergic rhinitis due to animal (cat) (dog) hair and dander: Secondary | ICD-10-CM | POA: Diagnosis not present

## 2022-07-02 DIAGNOSIS — J3089 Other allergic rhinitis: Secondary | ICD-10-CM | POA: Diagnosis not present

## 2022-07-05 DIAGNOSIS — K080 Exfoliation of teeth due to systemic causes: Secondary | ICD-10-CM | POA: Diagnosis not present

## 2022-07-08 DIAGNOSIS — R2232 Localized swelling, mass and lump, left upper limb: Secondary | ICD-10-CM | POA: Diagnosis not present

## 2022-07-08 DIAGNOSIS — E7800 Pure hypercholesterolemia, unspecified: Secondary | ICD-10-CM | POA: Diagnosis not present

## 2022-07-09 DIAGNOSIS — J301 Allergic rhinitis due to pollen: Secondary | ICD-10-CM | POA: Diagnosis not present

## 2022-07-09 DIAGNOSIS — J3089 Other allergic rhinitis: Secondary | ICD-10-CM | POA: Diagnosis not present

## 2022-07-09 DIAGNOSIS — J3081 Allergic rhinitis due to animal (cat) (dog) hair and dander: Secondary | ICD-10-CM | POA: Diagnosis not present

## 2022-07-12 DIAGNOSIS — J3081 Allergic rhinitis due to animal (cat) (dog) hair and dander: Secondary | ICD-10-CM | POA: Diagnosis not present

## 2022-07-12 DIAGNOSIS — J3089 Other allergic rhinitis: Secondary | ICD-10-CM | POA: Diagnosis not present

## 2022-07-12 DIAGNOSIS — J301 Allergic rhinitis due to pollen: Secondary | ICD-10-CM | POA: Diagnosis not present

## 2022-07-16 DIAGNOSIS — Z853 Personal history of malignant neoplasm of breast: Secondary | ICD-10-CM | POA: Diagnosis not present

## 2022-07-16 DIAGNOSIS — R928 Other abnormal and inconclusive findings on diagnostic imaging of breast: Secondary | ICD-10-CM | POA: Diagnosis not present

## 2022-07-16 DIAGNOSIS — N6320 Unspecified lump in the left breast, unspecified quadrant: Secondary | ICD-10-CM | POA: Diagnosis not present

## 2022-07-17 DIAGNOSIS — J3089 Other allergic rhinitis: Secondary | ICD-10-CM | POA: Diagnosis not present

## 2022-07-17 DIAGNOSIS — J301 Allergic rhinitis due to pollen: Secondary | ICD-10-CM | POA: Diagnosis not present

## 2022-07-17 DIAGNOSIS — J3081 Allergic rhinitis due to animal (cat) (dog) hair and dander: Secondary | ICD-10-CM | POA: Diagnosis not present

## 2022-07-24 DIAGNOSIS — J3089 Other allergic rhinitis: Secondary | ICD-10-CM | POA: Diagnosis not present

## 2022-07-31 DIAGNOSIS — J3089 Other allergic rhinitis: Secondary | ICD-10-CM | POA: Diagnosis not present

## 2022-07-31 DIAGNOSIS — J3081 Allergic rhinitis due to animal (cat) (dog) hair and dander: Secondary | ICD-10-CM | POA: Diagnosis not present

## 2022-07-31 DIAGNOSIS — J301 Allergic rhinitis due to pollen: Secondary | ICD-10-CM | POA: Diagnosis not present

## 2022-08-08 DIAGNOSIS — H2512 Age-related nuclear cataract, left eye: Secondary | ICD-10-CM | POA: Diagnosis not present

## 2022-08-14 DIAGNOSIS — J3089 Other allergic rhinitis: Secondary | ICD-10-CM | POA: Diagnosis not present

## 2022-08-21 DIAGNOSIS — J301 Allergic rhinitis due to pollen: Secondary | ICD-10-CM | POA: Diagnosis not present

## 2022-08-21 DIAGNOSIS — J3089 Other allergic rhinitis: Secondary | ICD-10-CM | POA: Diagnosis not present

## 2022-08-21 DIAGNOSIS — J3081 Allergic rhinitis due to animal (cat) (dog) hair and dander: Secondary | ICD-10-CM | POA: Diagnosis not present

## 2022-08-29 DIAGNOSIS — J301 Allergic rhinitis due to pollen: Secondary | ICD-10-CM | POA: Diagnosis not present

## 2022-08-29 DIAGNOSIS — J3081 Allergic rhinitis due to animal (cat) (dog) hair and dander: Secondary | ICD-10-CM | POA: Diagnosis not present

## 2022-08-29 DIAGNOSIS — J3089 Other allergic rhinitis: Secondary | ICD-10-CM | POA: Diagnosis not present

## 2022-09-04 DIAGNOSIS — J3081 Allergic rhinitis due to animal (cat) (dog) hair and dander: Secondary | ICD-10-CM | POA: Diagnosis not present

## 2022-09-04 DIAGNOSIS — J3089 Other allergic rhinitis: Secondary | ICD-10-CM | POA: Diagnosis not present

## 2022-09-04 DIAGNOSIS — J301 Allergic rhinitis due to pollen: Secondary | ICD-10-CM | POA: Diagnosis not present

## 2022-09-10 DIAGNOSIS — Z85820 Personal history of malignant melanoma of skin: Secondary | ICD-10-CM | POA: Diagnosis not present

## 2022-09-10 DIAGNOSIS — L738 Other specified follicular disorders: Secondary | ICD-10-CM | POA: Diagnosis not present

## 2022-09-10 DIAGNOSIS — L570 Actinic keratosis: Secondary | ICD-10-CM | POA: Diagnosis not present

## 2022-09-10 DIAGNOSIS — L821 Other seborrheic keratosis: Secondary | ICD-10-CM | POA: Diagnosis not present

## 2022-09-10 DIAGNOSIS — D225 Melanocytic nevi of trunk: Secondary | ICD-10-CM | POA: Diagnosis not present

## 2022-09-10 DIAGNOSIS — D485 Neoplasm of uncertain behavior of skin: Secondary | ICD-10-CM | POA: Diagnosis not present

## 2022-09-11 DIAGNOSIS — J301 Allergic rhinitis due to pollen: Secondary | ICD-10-CM | POA: Diagnosis not present

## 2022-09-11 DIAGNOSIS — J3089 Other allergic rhinitis: Secondary | ICD-10-CM | POA: Diagnosis not present

## 2022-09-11 DIAGNOSIS — J3081 Allergic rhinitis due to animal (cat) (dog) hair and dander: Secondary | ICD-10-CM | POA: Diagnosis not present

## 2022-09-18 DIAGNOSIS — J3089 Other allergic rhinitis: Secondary | ICD-10-CM | POA: Diagnosis not present

## 2022-09-25 DIAGNOSIS — J3089 Other allergic rhinitis: Secondary | ICD-10-CM | POA: Diagnosis not present

## 2022-10-02 DIAGNOSIS — J3089 Other allergic rhinitis: Secondary | ICD-10-CM | POA: Diagnosis not present

## 2022-10-09 DIAGNOSIS — J4520 Mild intermittent asthma, uncomplicated: Secondary | ICD-10-CM | POA: Diagnosis not present

## 2022-10-09 DIAGNOSIS — H1045 Other chronic allergic conjunctivitis: Secondary | ICD-10-CM | POA: Diagnosis not present

## 2022-10-09 DIAGNOSIS — J3089 Other allergic rhinitis: Secondary | ICD-10-CM | POA: Diagnosis not present

## 2022-10-15 DIAGNOSIS — Z23 Encounter for immunization: Secondary | ICD-10-CM | POA: Diagnosis not present

## 2022-10-23 DIAGNOSIS — J3089 Other allergic rhinitis: Secondary | ICD-10-CM | POA: Diagnosis not present

## 2022-10-23 DIAGNOSIS — J3081 Allergic rhinitis due to animal (cat) (dog) hair and dander: Secondary | ICD-10-CM | POA: Diagnosis not present

## 2022-10-23 DIAGNOSIS — J301 Allergic rhinitis due to pollen: Secondary | ICD-10-CM | POA: Diagnosis not present

## 2022-10-30 DIAGNOSIS — J3089 Other allergic rhinitis: Secondary | ICD-10-CM | POA: Diagnosis not present

## 2022-10-30 DIAGNOSIS — J301 Allergic rhinitis due to pollen: Secondary | ICD-10-CM | POA: Diagnosis not present

## 2022-11-06 DIAGNOSIS — J3089 Other allergic rhinitis: Secondary | ICD-10-CM | POA: Diagnosis not present

## 2022-11-06 DIAGNOSIS — J301 Allergic rhinitis due to pollen: Secondary | ICD-10-CM | POA: Diagnosis not present

## 2022-11-06 DIAGNOSIS — J3081 Allergic rhinitis due to animal (cat) (dog) hair and dander: Secondary | ICD-10-CM | POA: Diagnosis not present

## 2022-11-21 DIAGNOSIS — J3089 Other allergic rhinitis: Secondary | ICD-10-CM | POA: Diagnosis not present

## 2022-11-21 DIAGNOSIS — J301 Allergic rhinitis due to pollen: Secondary | ICD-10-CM | POA: Diagnosis not present

## 2022-11-21 DIAGNOSIS — J3081 Allergic rhinitis due to animal (cat) (dog) hair and dander: Secondary | ICD-10-CM | POA: Diagnosis not present

## 2022-11-27 DIAGNOSIS — J301 Allergic rhinitis due to pollen: Secondary | ICD-10-CM | POA: Diagnosis not present

## 2022-11-27 DIAGNOSIS — J3089 Other allergic rhinitis: Secondary | ICD-10-CM | POA: Diagnosis not present

## 2022-12-04 DIAGNOSIS — J301 Allergic rhinitis due to pollen: Secondary | ICD-10-CM | POA: Diagnosis not present

## 2022-12-04 DIAGNOSIS — J3081 Allergic rhinitis due to animal (cat) (dog) hair and dander: Secondary | ICD-10-CM | POA: Diagnosis not present

## 2022-12-04 DIAGNOSIS — J3089 Other allergic rhinitis: Secondary | ICD-10-CM | POA: Diagnosis not present

## 2022-12-11 DIAGNOSIS — J3089 Other allergic rhinitis: Secondary | ICD-10-CM | POA: Diagnosis not present

## 2022-12-11 DIAGNOSIS — J301 Allergic rhinitis due to pollen: Secondary | ICD-10-CM | POA: Diagnosis not present

## 2022-12-11 DIAGNOSIS — J3081 Allergic rhinitis due to animal (cat) (dog) hair and dander: Secondary | ICD-10-CM | POA: Diagnosis not present

## 2022-12-18 DIAGNOSIS — J301 Allergic rhinitis due to pollen: Secondary | ICD-10-CM | POA: Diagnosis not present

## 2022-12-18 DIAGNOSIS — J3081 Allergic rhinitis due to animal (cat) (dog) hair and dander: Secondary | ICD-10-CM | POA: Diagnosis not present

## 2022-12-18 DIAGNOSIS — J3089 Other allergic rhinitis: Secondary | ICD-10-CM | POA: Diagnosis not present

## 2023-01-01 DIAGNOSIS — J3089 Other allergic rhinitis: Secondary | ICD-10-CM | POA: Diagnosis not present

## 2023-01-08 DIAGNOSIS — J301 Allergic rhinitis due to pollen: Secondary | ICD-10-CM | POA: Diagnosis not present

## 2023-01-08 DIAGNOSIS — J3081 Allergic rhinitis due to animal (cat) (dog) hair and dander: Secondary | ICD-10-CM | POA: Diagnosis not present

## 2023-01-08 DIAGNOSIS — J3089 Other allergic rhinitis: Secondary | ICD-10-CM | POA: Diagnosis not present

## 2023-01-15 DIAGNOSIS — J3089 Other allergic rhinitis: Secondary | ICD-10-CM | POA: Diagnosis not present

## 2023-01-15 DIAGNOSIS — J301 Allergic rhinitis due to pollen: Secondary | ICD-10-CM | POA: Diagnosis not present

## 2023-01-15 DIAGNOSIS — J3081 Allergic rhinitis due to animal (cat) (dog) hair and dander: Secondary | ICD-10-CM | POA: Diagnosis not present

## 2023-01-22 DIAGNOSIS — J3081 Allergic rhinitis due to animal (cat) (dog) hair and dander: Secondary | ICD-10-CM | POA: Diagnosis not present

## 2023-01-22 DIAGNOSIS — J3089 Other allergic rhinitis: Secondary | ICD-10-CM | POA: Diagnosis not present

## 2023-01-22 DIAGNOSIS — J301 Allergic rhinitis due to pollen: Secondary | ICD-10-CM | POA: Diagnosis not present

## 2023-03-25 ENCOUNTER — Ambulatory Visit: Payer: Medicare Other | Admitting: Family Medicine

## 2023-03-25 VITALS — BP 112/72 | Ht 61.0 in | Wt 138.0 lb

## 2023-03-25 DIAGNOSIS — M2142 Flat foot [pes planus] (acquired), left foot: Secondary | ICD-10-CM

## 2023-03-25 DIAGNOSIS — M2141 Flat foot [pes planus] (acquired), right foot: Secondary | ICD-10-CM | POA: Diagnosis not present

## 2023-03-25 DIAGNOSIS — R269 Unspecified abnormalities of gait and mobility: Secondary | ICD-10-CM | POA: Diagnosis not present

## 2023-03-26 ENCOUNTER — Encounter: Payer: Self-pay | Admitting: Family Medicine

## 2023-03-26 NOTE — Progress Notes (Signed)
PCP: Glenis Smoker, MD  Subjective:   HPI: Patient is a 72 y.o. female here to reassess her orthotics.  05/07/21: She has a history of pes planus and has had orthotics in the past. Most recently had some OTC orthotics 2 months prior which are starting to become less supportive and more thin. She hikes often. Has trouble with left foot bunion that feels like pins and needles and burning. Left foot bothers her more than the right. Also had leg length discrepancy which she states right leg is about 1/8 inch longer than her left.   03/25/23: Patient reports she's doing well. No pain in legs or feet. Has some mild discomfort in her low back the past couple days. As weather is getting nicer she is planning to walk outside more and wants to make sure her orthotics are ok.  Past Medical History:  Diagnosis Date   Breast cancer    Hyperlipidemia     Current Outpatient Medications on File Prior to Visit  Medication Sig Dispense Refill   Calcium Carbonate-Vit D-Min (CALCIUM 1200 PO) Take 1,200 mg by mouth daily.     EPINEPHrine 0.3 mg/0.3 mL IJ SOAJ injection SMARTSIG:0.3 Milligram(s) IM Once PRN     guaiFENesin (MUCINEX) 600 MG 12 hr tablet Take 1,200 mg by mouth 2 (two) times daily as needed.     ketotifen (ALAWAY) 0.025 % ophthalmic solution Place 1 drop into both eyes 2 (two) times daily as needed.     loratadine (CLARITIN) 10 MG tablet Take 10 mg by mouth daily as needed for allergies.     Multiple Vitamin (MULTIVITAMIN) tablet Take 1 tablet by mouth daily.     Polyethyl Glycol-Propyl Glycol (SYSTANE) 0.4-0.3 % SOLN Apply to eye as needed. Dry eyes     VITAMIN D, ERGOCALCIFEROL, PO Take 2,800 Units by mouth daily.     No current facility-administered medications on file prior to visit.    Allergies  Allergen Reactions   Adhesive [Tape]    Amoxicillin    Azithromycin    Clarithromycin    Doxycycline    Latex    Levsin [Hyoscyamine]    Meperidine And Related    Sulfa  Antibiotics     Social History   Socioeconomic History   Marital status: Married    Spouse name: Not on file   Number of children: Not on file   Years of education: Not on file   Highest education level: Not on file  Occupational History   Not on file  Tobacco Use   Smoking status: Not on file   Smokeless tobacco: Not on file  Substance and Sexual Activity   Alcohol use: Not on file   Drug use: Not on file   Sexual activity: Not on file  Other Topics Concern   Not on file  Social History Narrative   Not on file   Social Determinants of Health   Financial Resource Strain: Not on file  Food Insecurity: Not on file  Transportation Needs: Not on file  Physical Activity: Not on file  Stress: Not on file  Social Connections: Not on file  Intimate Partner Violence: Not on file    History reviewed. No pertinent family history.  BP 112/72   Ht 5\' 1"  (1.549 m)   Wt 138 lb (62.6 kg)   BMI 26.07 kg/m       No data to display              No data  to display          Review of Systems: See HPI above.     Objective:  Physical Exam:  Gen: NAD, comfortable in exam room  Left foot/ankle: Bunion.  Hallux rigidus and valgus.  No transverse arch collapse.  No other deformity,  swelling, ecchymoses FROM ankle. No TTP NV intact distally.  Right foot/ankle: Hallux rigidus.  No transverse arch collapse.  No other deformity, swelling, bruising. FROM ankle. No TTP. NVI distally.  Minimal leg length discrepancy with right longer than left.  Assessment & Plan:  1. Pes planus with left bunion.  Assessed her orthotics which are in very good shape - no cracking or breakdown, good depth left of EVA base.  Suspect she will get at least another year out of these.  Overall she is doing well - advised to call us with any concerns.  All questions answered.

## 2023-08-12 IMAGING — CT CT CARDIAC CORONARY ARTERY CALCIUM SCORE
3 series · 14 of 20 positions shown, 15 images · non-contrast
Comparison: None Available.
COMPARISON: None Available.

Addendum:
CLINICAL DATA: This over-read does not include interpretation of
cardiac or coronary anatomy or pathology. The coronary calcium score
interpretation by the cardiologist is attached.
CLINICAL DATA: Cardiovascular Disease Risk stratification

EXAM:
Coronary Calcium Score
TECHNIQUE: A gated, non-contrast computed tomography scan of the heart was
performed using 3mm slice thickness. Axial images were analyzed on a
dedicated workstation. Calcium scoring of the coronary arteries was
performed using the Agatston method.

[Series 2: ax ca scr 70% (id) · axial · 0.39mm/px · z∈[-220,-118]mm · 6 of 73 slices shown]
[im 11/73  vessel]
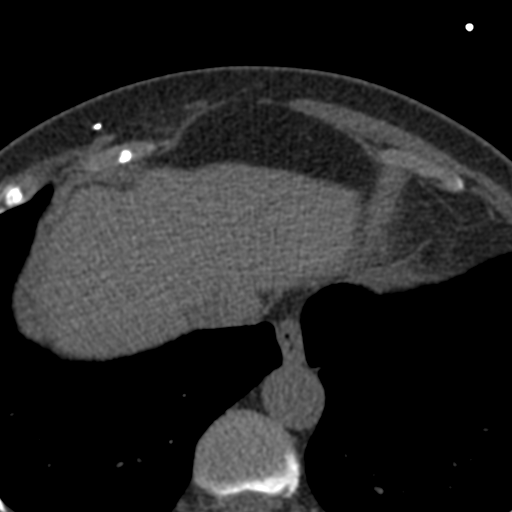
[im 21/73  vessel]
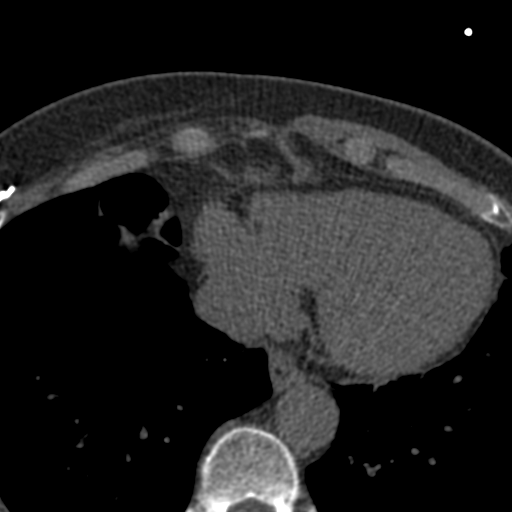
[im 31/73  vessel]
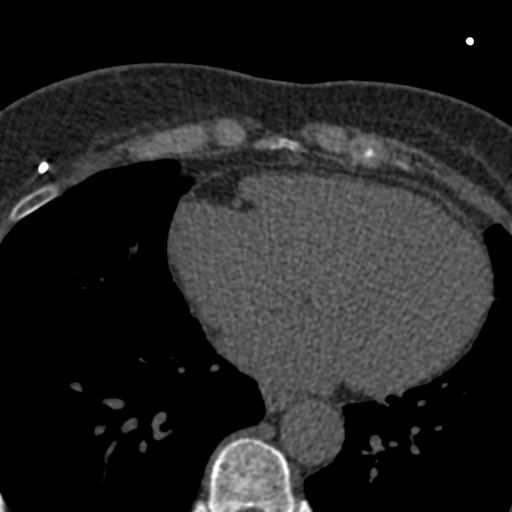
[im 42/73  vessel]
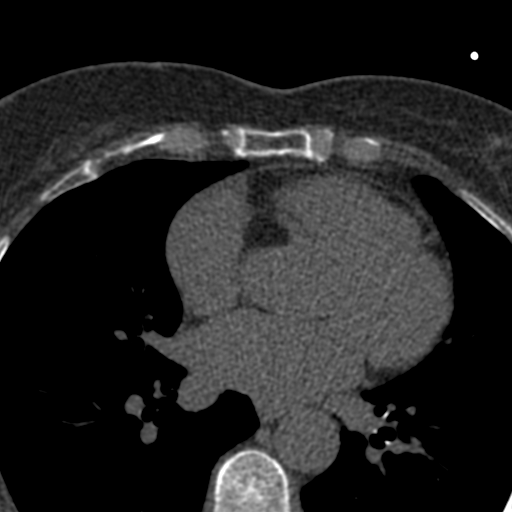
[im 52/73  vessel]
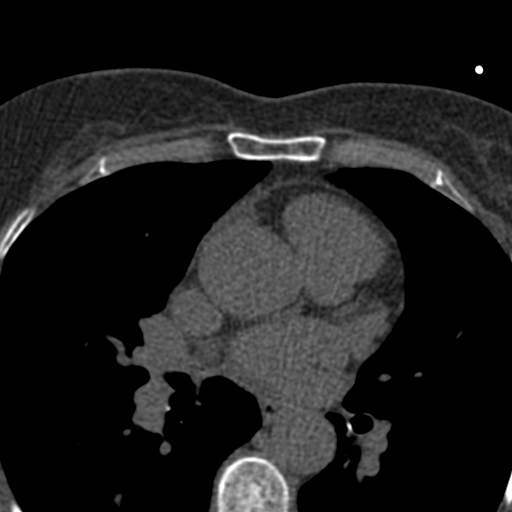
[im 62/73  vessel]
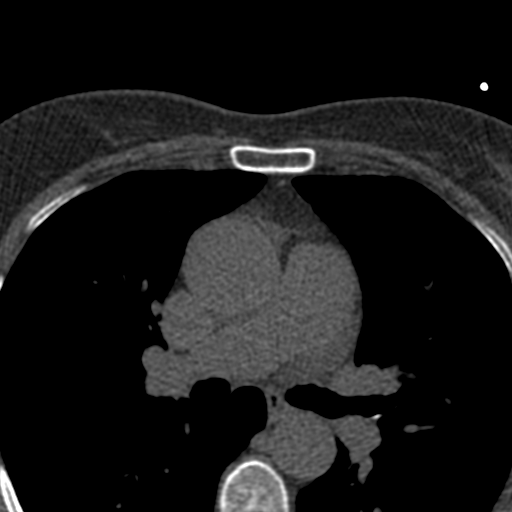

[Series 3: ax st · axial · 0.61mm/px · z∈[-214,-127]mm · 4 of 49 slices shown, 5 images]
[im 10/49  vessel]
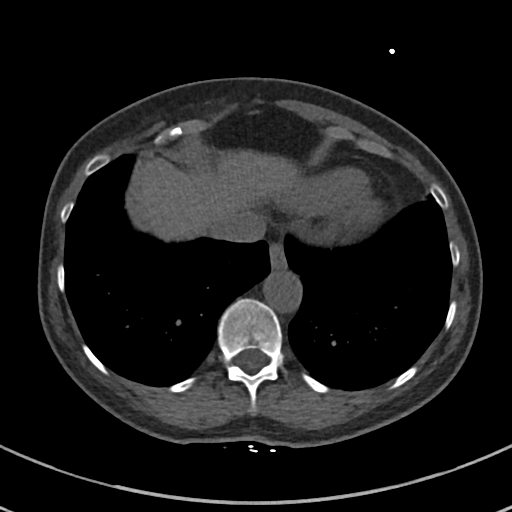
[im 10/49  lung]
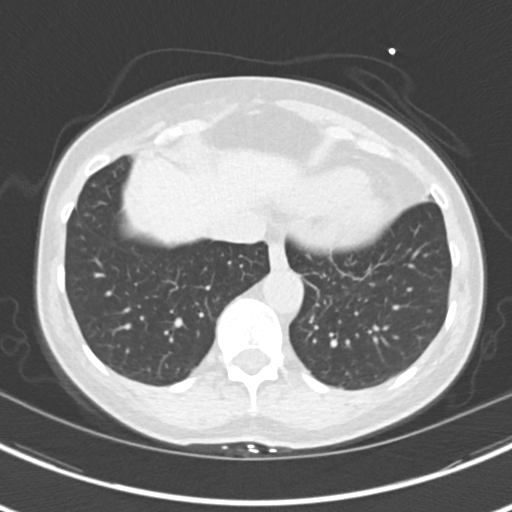
[im 20/49  vessel]
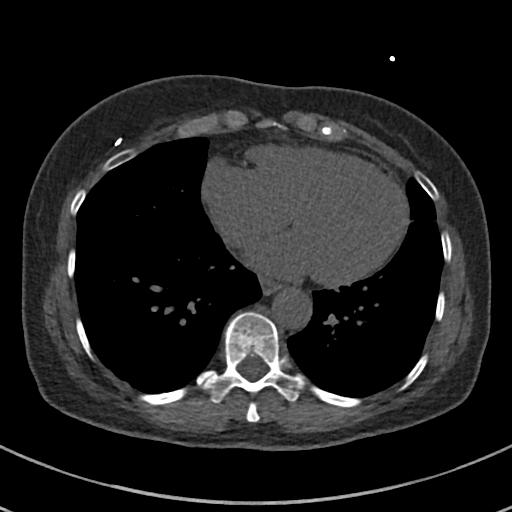
[im 29/49  vessel]
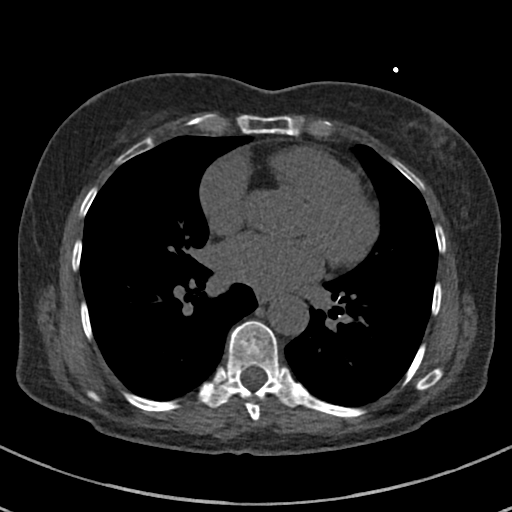
[im 39/49  vessel]
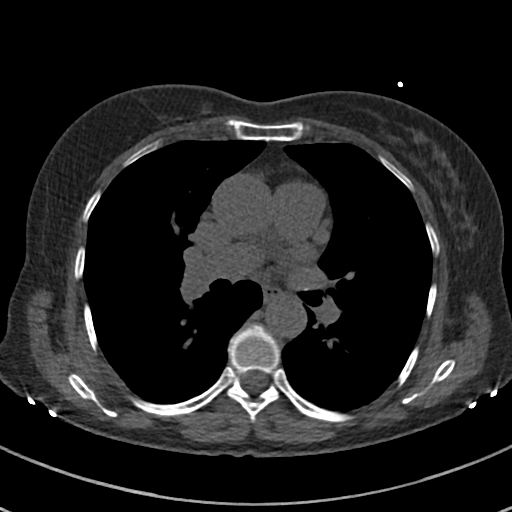

[Series 4: ax lung · axial · 0.70mm/px · z∈[-214,-127]mm · 4 of 49 slices shown]
[im 10/49  lung]
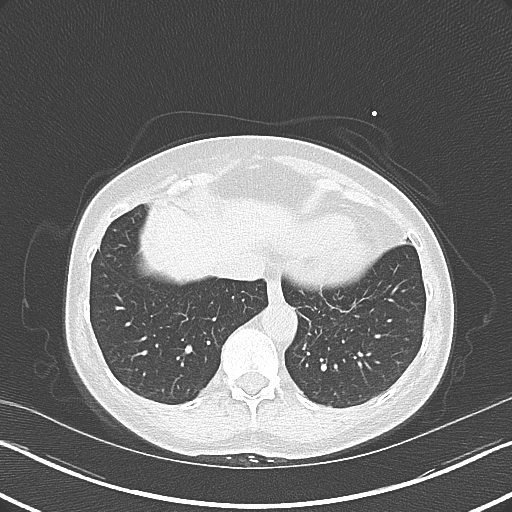
[im 20/49  lung]
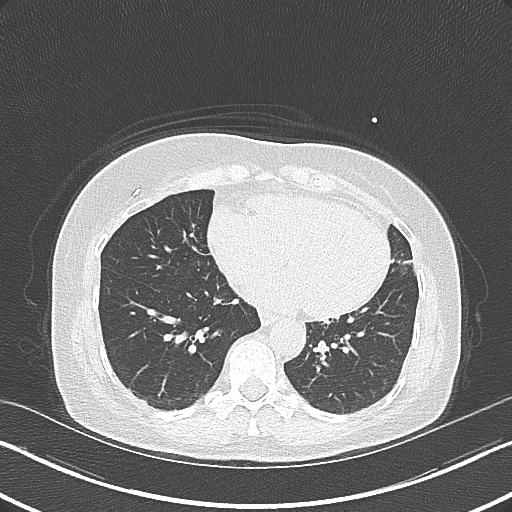
[im 29/49  lung]
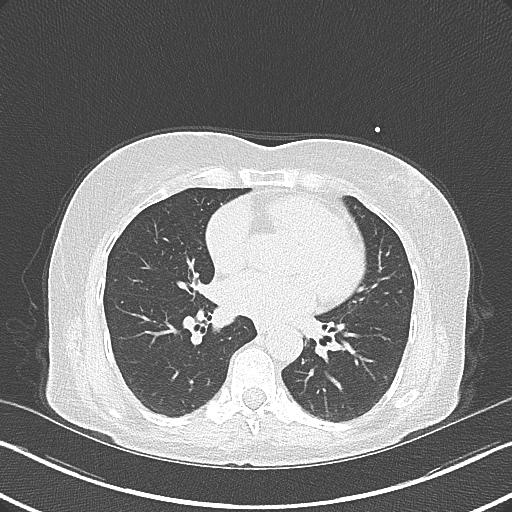
[im 39/49  lung]
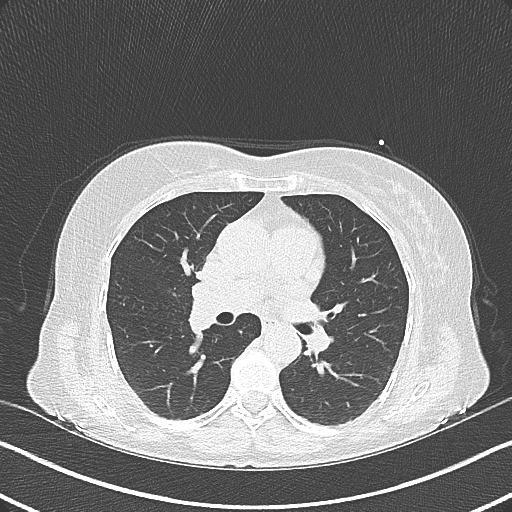

[14 of 20 positions shown; findings below may reference images not displayed]

FINDINGS: Vascular: Heart size is normal.  No pericardial effusion.

Mediastinum/Nodes: No adenopathy within the included chest. Distal
esophagus within normal limits.

Lungs/Pleura: Minimal linear scarring/atelectasis in the right
middle lobe and lingula. Included lung fields are otherwise clear.

Upper Abdomen: No acute findings.

Musculoskeletal: Postsurgical changes within the right breast and
axilla. No acute bony findings.
IMPRESSION: No acute extracardiac findings.
FINDINGS: Coronary arteries: Normal origins.

Coronary Calcium Score:

Left main: 0

Left anterior descending artery: 0

Left circumflex artery: 0

Right coronary artery: 0

Total: 0

Percentile: 0

Pericardium: Normal.

Aorta: Borderline dilated at 37 mm (level of the main PA,
non-contrast)

Non-cardiac: See separate report from [REDACTED].
IMPRESSION: 1. Coronary calcium score of 0.
2. Borderline dilated aorta to 37 mm at the level of the main PA
(non-contrast)



If CAC=0, it is reasonable to withhold statin therapy and reassess
in 5 to 10 years, as long as higher risk conditions are absent
(diabetes mellitus, family history of premature CHD in first degree
relatives (males <55 years; females <65 years), cigarette smoking,
or LDL >=190 mg/dL).

If CAC is 1 to 99, it is reasonable to initiate statin therapy for
patients >=55 years of age.

If CAC is >=100 or >=75th percentile, it is reasonable to initiate
statin therapy at any age.

Cardiology referral should be considered for patients with CAC
scores >=400 or >=75th percentile.

*2300 AHA/ACC/AACVPR/AAPA/ABC/TONICLENIO/KYOICHI/RENDON/Hummel/MOATSHE/ATHALIA/SIBLAG
Guideline on the Management of Blood Cholesterol: A Report of the
American College of Cardiology/American Heart Association Task Force
on Clinical Practice Guidelines. J Am Coll Cardiol.
1857;73(24):3174-3207.

*** End of Addendum ***
FINDINGS: Vascular: Heart size is normal.  No pericardial effusion.

Mediastinum/Nodes: No adenopathy within the included chest. Distal
esophagus within normal limits.

Lungs/Pleura: Minimal linear scarring/atelectasis in the right
middle lobe and lingula. Included lung fields are otherwise clear.

Upper Abdomen: No acute findings.

Musculoskeletal: Postsurgical changes within the right breast and
axilla. No acute bony findings.
IMPRESSION: No acute extracardiac findings.

## 2023-08-18 IMAGING — CT CT ABD-PELV W/ CM
2 of 5 series · 17 of 46 positions shown, 19 images · IV contrast (APPLIED)
Comparison: None Available.

CLINICAL DATA: Right lower quadrant pain

EXAM:
CT ABDOMEN AND PELVIS WITH CONTRAST
TECHNIQUE: Multidetector CT imaging of the abdomen and pelvis was performed
using the standard protocol following bolus administration of
intravenous contrast.

[Series 2: abd pel w · axial · 0.78mm/px · z∈[-346,+24]mm · 14 of 84 slices shown, 16 images]
[im 5/84  soft-tissue]
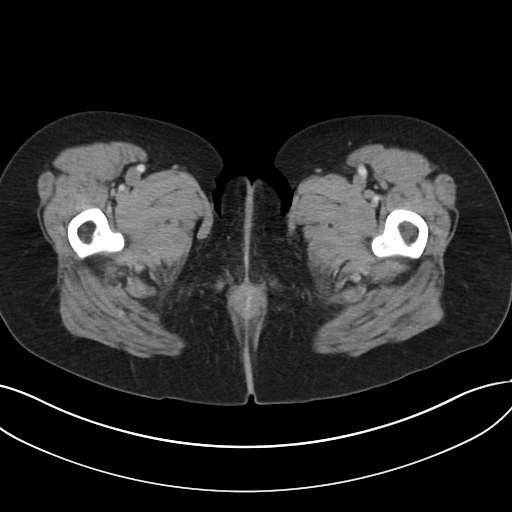
[im 5/84  bone]
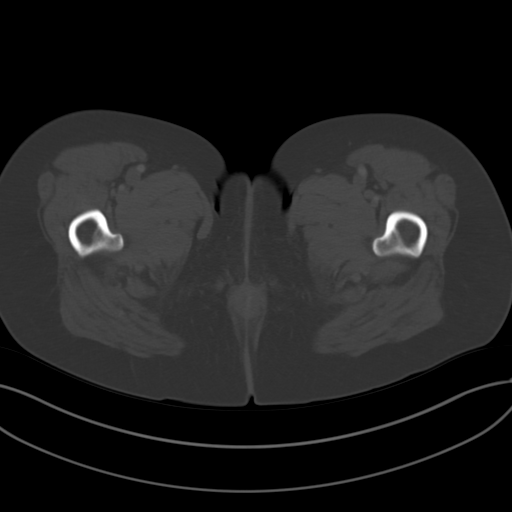
[im 10/84  soft-tissue]
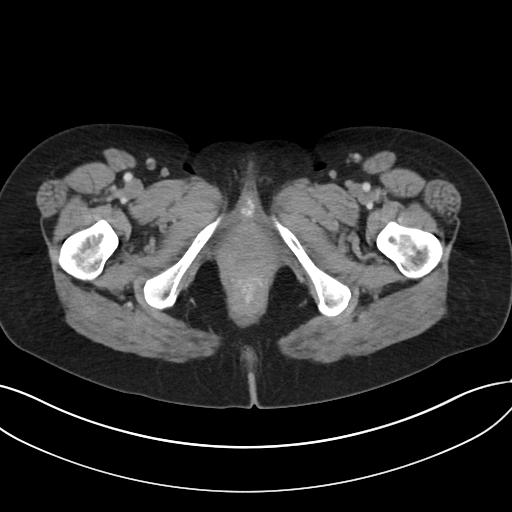
[im 19/84  soft-tissue]
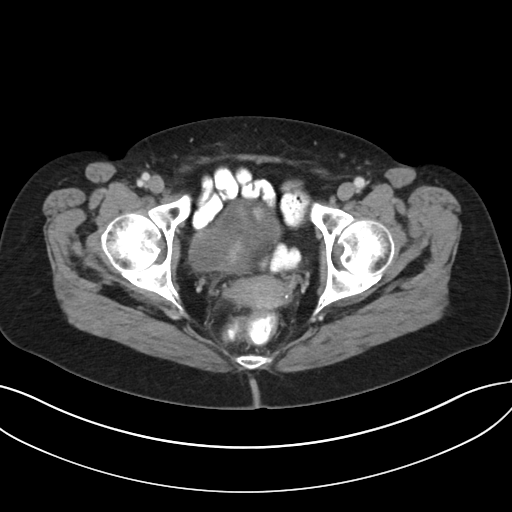
[im 24/84  soft-tissue]
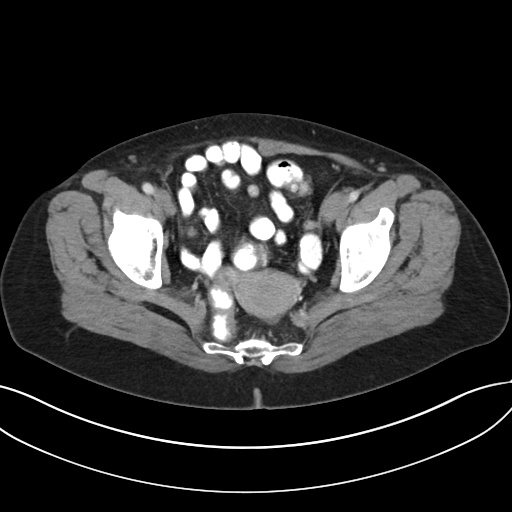
[im 28/84  soft-tissue]
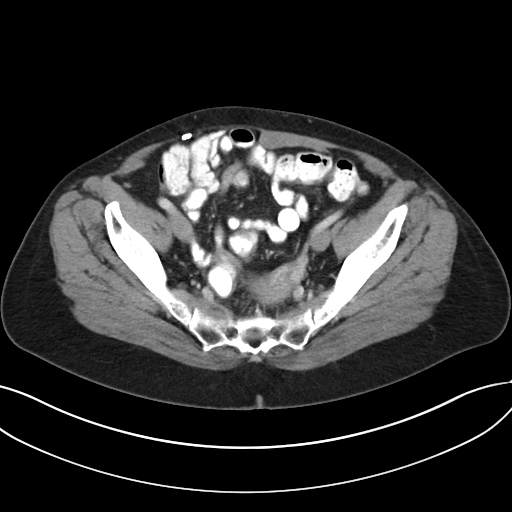
[im 33/84  soft-tissue]
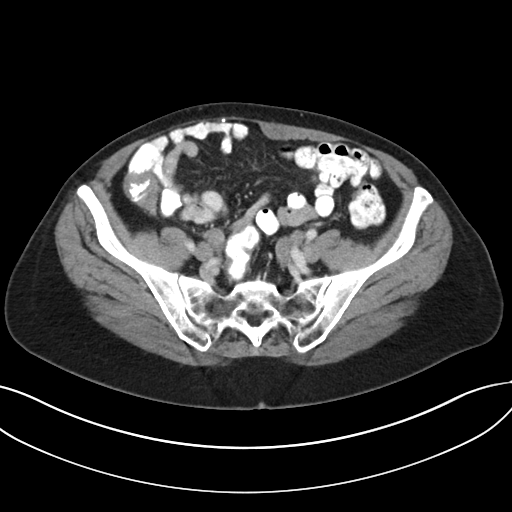
[im 37/84  soft-tissue]
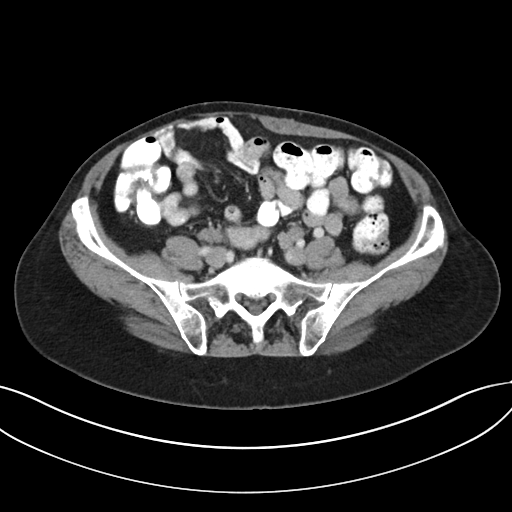
[im 47/84  soft-tissue]
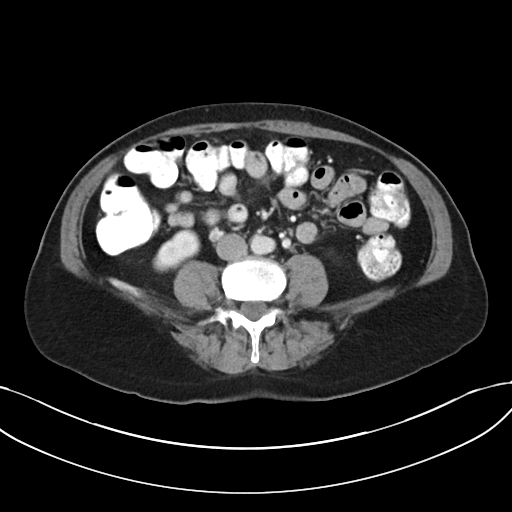
[im 51/84  soft-tissue]
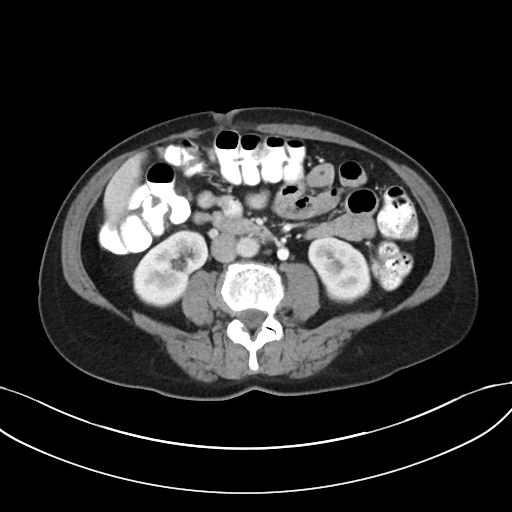
[im 51/84  bone]
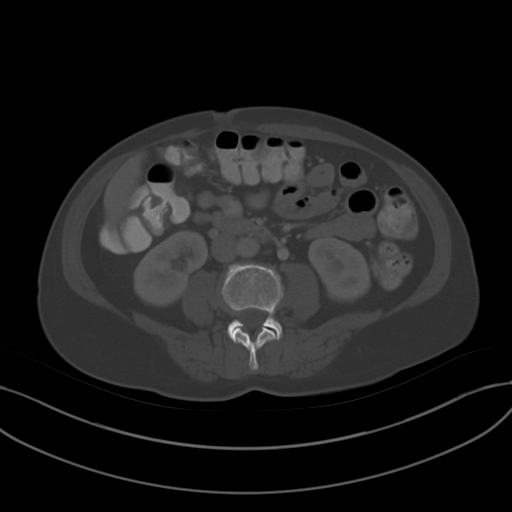
[im 56/84  soft-tissue]
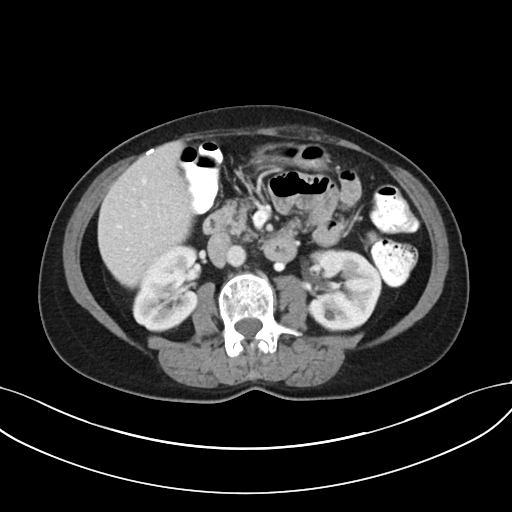
[im 60/84  soft-tissue]
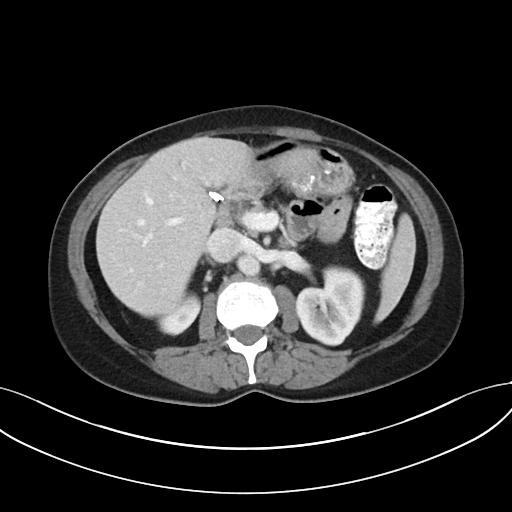
[im 65/84  soft-tissue]
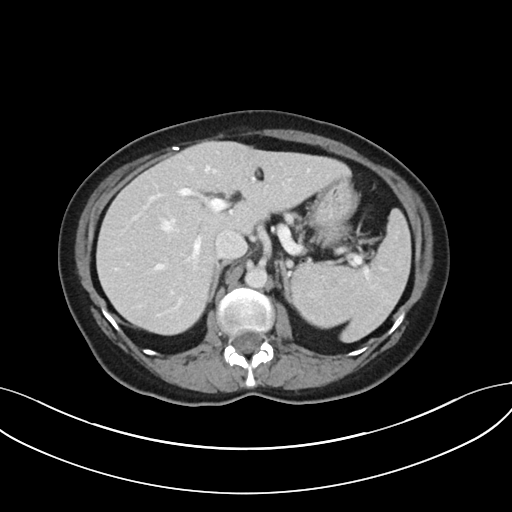
[im 74/84  soft-tissue]
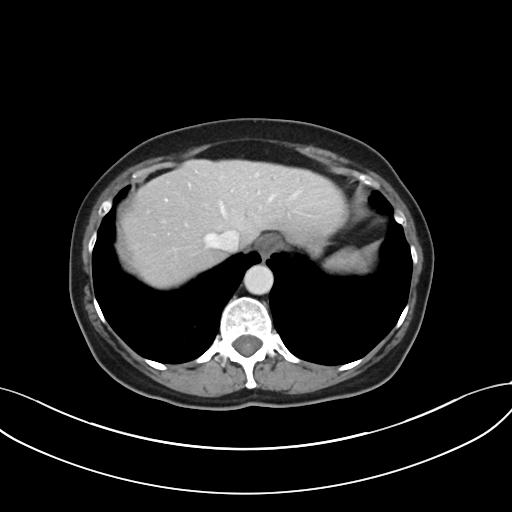
[im 79/84  soft-tissue]
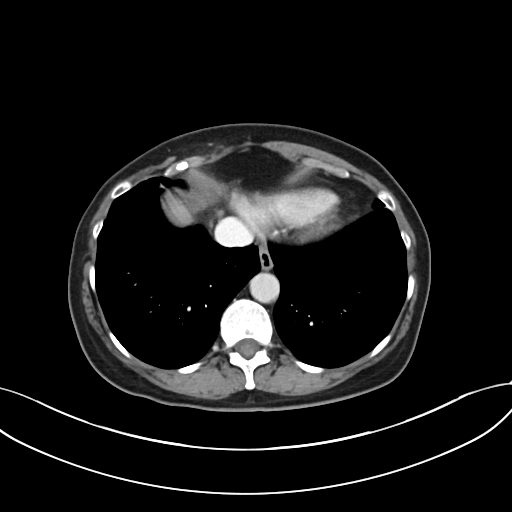

[Series 5: coronal · coronal · 0.78mm/px · 3 of 87 slices shown]
[im 29/87  soft-tissue]
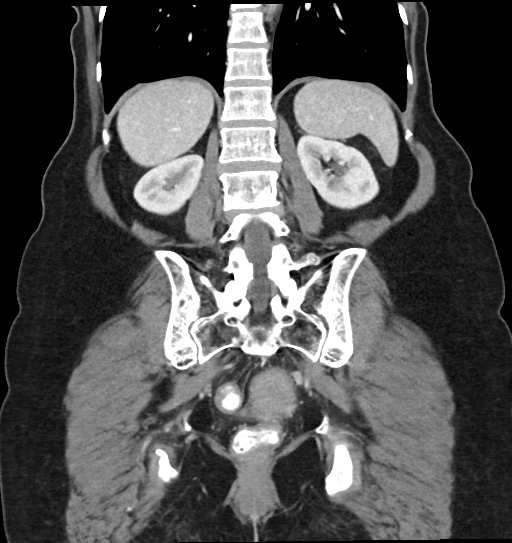
[im 39/87  soft-tissue]
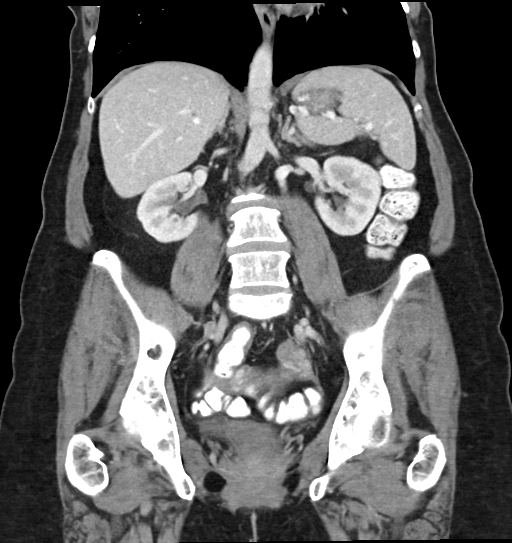
[im 48/87  soft-tissue]
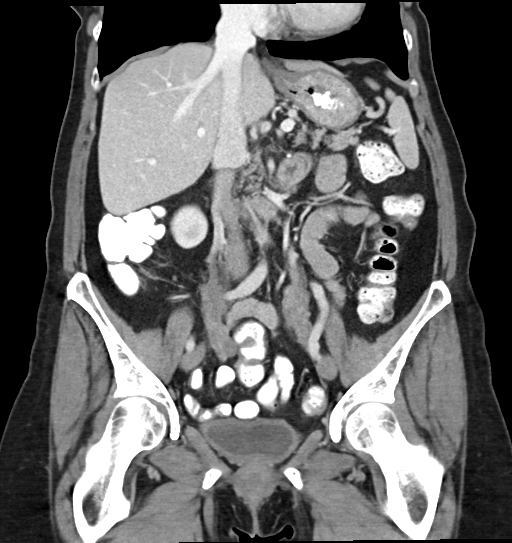

[17 of 46 positions shown; findings below may reference images not displayed]

RADIATION DOSE REDUCTION: This exam was performed according to the
departmental dose-optimization program which includes automated
exposure control, adjustment of the mA and/or kV according to
patient size and/or use of iterative reconstruction technique.

CONTRAST:  75mL OMNIPAQUE IOHEXOL 300 MG/ML  SOLN
FINDINGS: Lower chest: No acute abnormality.

Hepatobiliary: No focal liver abnormality is seen. Status post
cholecystectomy. No unexpected biliary dilatation.

Pancreas: Atrophy.  Otherwise unremarkable.

Spleen: Unremarkable.

Adrenals/Urinary Tract: Punctate nonobstructing calculus of the
lower pole the right kidney. Too small to characterize low-density
lesion of the left kidney. Mild bladder wall thickening is probably
due to under distension.

Stomach/Bowel: Stomach is within normal limits. Bowel is normal in
caliber. Normal appendix.

Vascular/Lymphatic: No significant vascular abnormality. No enlarged
nodes.

Reproductive: Uterus and bilateral adnexa are unremarkable.

Other: Postoperative changes of the right ventral abdominal wall. No
free fluid.

Musculoskeletal: No acute osseous abnormality.
IMPRESSION: No acute abnormality.  Normal appendix.

Punctate right renal stone.

## 2023-10-06 DIAGNOSIS — M67442 Ganglion, left hand: Secondary | ICD-10-CM | POA: Diagnosis not present

## 2023-10-08 DIAGNOSIS — J3089 Other allergic rhinitis: Secondary | ICD-10-CM | POA: Diagnosis not present

## 2023-10-08 DIAGNOSIS — H1045 Other chronic allergic conjunctivitis: Secondary | ICD-10-CM | POA: Diagnosis not present

## 2023-10-08 DIAGNOSIS — J4520 Mild intermittent asthma, uncomplicated: Secondary | ICD-10-CM | POA: Diagnosis not present

## 2023-10-09 DIAGNOSIS — Z23 Encounter for immunization: Secondary | ICD-10-CM | POA: Diagnosis not present

## 2023-11-27 DIAGNOSIS — D485 Neoplasm of uncertain behavior of skin: Secondary | ICD-10-CM | POA: Diagnosis not present

## 2023-11-27 DIAGNOSIS — D2271 Melanocytic nevi of right lower limb, including hip: Secondary | ICD-10-CM | POA: Diagnosis not present

## 2023-11-27 DIAGNOSIS — D225 Melanocytic nevi of trunk: Secondary | ICD-10-CM | POA: Diagnosis not present

## 2023-12-11 DIAGNOSIS — L988 Other specified disorders of the skin and subcutaneous tissue: Secondary | ICD-10-CM | POA: Diagnosis not present

## 2023-12-11 DIAGNOSIS — D485 Neoplasm of uncertain behavior of skin: Secondary | ICD-10-CM | POA: Diagnosis not present

## 2024-01-01 DIAGNOSIS — L218 Other seborrheic dermatitis: Secondary | ICD-10-CM | POA: Diagnosis not present

## 2024-01-01 DIAGNOSIS — D045 Carcinoma in situ of skin of trunk: Secondary | ICD-10-CM | POA: Diagnosis not present

## 2024-01-01 DIAGNOSIS — D044 Carcinoma in situ of skin of scalp and neck: Secondary | ICD-10-CM | POA: Diagnosis not present

## 2024-01-01 DIAGNOSIS — D1801 Hemangioma of skin and subcutaneous tissue: Secondary | ICD-10-CM | POA: Diagnosis not present

## 2024-01-01 DIAGNOSIS — D225 Melanocytic nevi of trunk: Secondary | ICD-10-CM | POA: Diagnosis not present

## 2024-01-06 DIAGNOSIS — D044 Carcinoma in situ of skin of scalp and neck: Secondary | ICD-10-CM | POA: Diagnosis not present

## 2024-01-30 DIAGNOSIS — L570 Actinic keratosis: Secondary | ICD-10-CM | POA: Diagnosis not present

## 2024-01-30 DIAGNOSIS — L738 Other specified follicular disorders: Secondary | ICD-10-CM | POA: Diagnosis not present

## 2024-03-26 DIAGNOSIS — L570 Actinic keratosis: Secondary | ICD-10-CM | POA: Diagnosis not present

## 2024-03-26 DIAGNOSIS — R21 Rash and other nonspecific skin eruption: Secondary | ICD-10-CM | POA: Diagnosis not present

## 2024-05-10 DIAGNOSIS — M25511 Pain in right shoulder: Secondary | ICD-10-CM | POA: Diagnosis not present

## 2024-05-10 DIAGNOSIS — L508 Other urticaria: Secondary | ICD-10-CM | POA: Diagnosis not present

## 2024-05-13 DIAGNOSIS — M25511 Pain in right shoulder: Secondary | ICD-10-CM | POA: Diagnosis not present

## 2024-05-13 DIAGNOSIS — L509 Urticaria, unspecified: Secondary | ICD-10-CM | POA: Diagnosis not present

## 2024-05-18 DIAGNOSIS — D485 Neoplasm of uncertain behavior of skin: Secondary | ICD-10-CM | POA: Diagnosis not present

## 2024-05-18 DIAGNOSIS — D4819 Other specified neoplasm of uncertain behavior of connective and other soft tissue: Secondary | ICD-10-CM | POA: Diagnosis not present

## 2024-05-20 DIAGNOSIS — M25511 Pain in right shoulder: Secondary | ICD-10-CM | POA: Diagnosis not present

## 2024-05-24 DIAGNOSIS — M25511 Pain in right shoulder: Secondary | ICD-10-CM | POA: Diagnosis not present

## 2024-05-25 DIAGNOSIS — Z853 Personal history of malignant neoplasm of breast: Secondary | ICD-10-CM | POA: Diagnosis not present

## 2024-05-25 DIAGNOSIS — E559 Vitamin D deficiency, unspecified: Secondary | ICD-10-CM | POA: Diagnosis not present

## 2024-05-25 DIAGNOSIS — E7800 Pure hypercholesterolemia, unspecified: Secondary | ICD-10-CM | POA: Diagnosis not present

## 2024-05-25 DIAGNOSIS — M542 Cervicalgia: Secondary | ICD-10-CM | POA: Diagnosis not present

## 2024-05-25 DIAGNOSIS — Z0000 Encounter for general adult medical examination without abnormal findings: Secondary | ICD-10-CM | POA: Diagnosis not present

## 2024-05-25 DIAGNOSIS — Z79899 Other long term (current) drug therapy: Secondary | ICD-10-CM | POA: Diagnosis not present

## 2024-05-26 ENCOUNTER — Other Ambulatory Visit: Payer: Self-pay | Admitting: Family Medicine

## 2024-05-26 DIAGNOSIS — Z853 Personal history of malignant neoplasm of breast: Secondary | ICD-10-CM

## 2024-05-26 DIAGNOSIS — M542 Cervicalgia: Secondary | ICD-10-CM

## 2024-05-26 DIAGNOSIS — Z85820 Personal history of malignant melanoma of skin: Secondary | ICD-10-CM

## 2024-05-26 DIAGNOSIS — M25511 Pain in right shoulder: Secondary | ICD-10-CM | POA: Diagnosis not present

## 2024-05-29 ENCOUNTER — Ambulatory Visit
Admission: RE | Admit: 2024-05-29 | Discharge: 2024-05-29 | Disposition: A | Discharge status: Home / Self Care | Source: Ambulatory Visit | Attending: Family Medicine | Admitting: Family Medicine

## 2024-05-29 DIAGNOSIS — Z85820 Personal history of malignant melanoma of skin: Secondary | ICD-10-CM

## 2024-05-29 DIAGNOSIS — M25511 Pain in right shoulder: Secondary | ICD-10-CM | POA: Diagnosis not present

## 2024-05-29 DIAGNOSIS — R937 Abnormal findings on diagnostic imaging of other parts of musculoskeletal system: Secondary | ICD-10-CM | POA: Diagnosis not present

## 2024-05-29 DIAGNOSIS — M19011 Primary osteoarthritis, right shoulder: Secondary | ICD-10-CM | POA: Diagnosis not present

## 2024-05-29 DIAGNOSIS — M542 Cervicalgia: Secondary | ICD-10-CM | POA: Diagnosis not present

## 2024-05-29 DIAGNOSIS — Z853 Personal history of malignant neoplasm of breast: Secondary | ICD-10-CM

## 2024-05-29 DIAGNOSIS — M75101 Unspecified rotator cuff tear or rupture of right shoulder, not specified as traumatic: Secondary | ICD-10-CM | POA: Diagnosis not present

## 2024-05-29 MED ORDER — GADOPICLENOL 0.5 MMOL/ML IV SOLN
7.0000 mL | Freq: Once | INTRAVENOUS | Status: AC | PRN
  Administered 2024-05-29: 7 mL via INTRAVENOUS

## 2024-05-29 MED ORDER — GADOPICLENOL 0.5 MMOL/ML IV SOLN: 7.0000 mL | Freq: Once | INTRAVENOUS | Status: DC | PRN

## 2024-05-31 ENCOUNTER — Encounter: Payer: Self-pay | Admitting: Dermatology

## 2024-05-31 DIAGNOSIS — M25511 Pain in right shoulder: Secondary | ICD-10-CM | POA: Diagnosis not present

## 2024-06-01 ENCOUNTER — Encounter: Payer: Self-pay | Admitting: Medical Oncology

## 2024-06-01 DIAGNOSIS — Z853 Personal history of malignant neoplasm of breast: Secondary | ICD-10-CM | POA: Insufficient documentation

## 2024-06-01 DIAGNOSIS — E559 Vitamin D deficiency, unspecified: Secondary | ICD-10-CM | POA: Insufficient documentation

## 2024-06-01 DIAGNOSIS — C50919 Malignant neoplasm of unspecified site of unspecified female breast: Secondary | ICD-10-CM | POA: Insufficient documentation

## 2024-06-01 DIAGNOSIS — J45909 Unspecified asthma, uncomplicated: Secondary | ICD-10-CM | POA: Insufficient documentation

## 2024-06-01 DIAGNOSIS — E7800 Pure hypercholesterolemia, unspecified: Secondary | ICD-10-CM | POA: Insufficient documentation

## 2024-06-01 DIAGNOSIS — J309 Allergic rhinitis, unspecified: Secondary | ICD-10-CM | POA: Insufficient documentation

## 2024-06-01 DIAGNOSIS — E2839 Other primary ovarian failure: Secondary | ICD-10-CM | POA: Insufficient documentation

## 2024-06-01 DIAGNOSIS — Z860100 Personal history of colon polyps, unspecified: Secondary | ICD-10-CM | POA: Insufficient documentation

## 2024-06-01 NOTE — Progress Notes (Signed)
 [facility] [phone]   [phone]  INITIAL CONSULTATION:  Patient Care Team: Ransom Byers, MD as PCP - General (Family Medicine)  CHIEF COMPLAINTS/PURPOSE OF CONSULTATION:  Bone lesion  HISTORY OF PRESENTING ILLNESS:  Rachel Stephens 73 y.o. female with medical history significant for melanoma in situ in August 2019, allergic rhinitis, asthma, stage II right-sided breast cancer diagnosed in February 2000, pure hypercholesterolemia, vitamin D deficiency.  On review of the previous records patient is being referred by primary care provider Dr. Denna Fish. Patient was having pain out of proportion in her right shoulder despite starting physical therapy, trying meloxicam and an injection. Patient had MR cervical spine and right shoulder on 060/07/25 for further evaluation of symptoms. MR cervical spine shows concern for focal metastatic disease or multiple myeloma with diffuse T2 hyperintensity within the left posterior elements at C3 and 12 mm T2 hyperintense lesion within the right transverse process of T1. MRI of right shoulder shows orthopedic related changes. Patient is established with Dr. Del Favia, sports medicine at [facility]. Further chart review shows patient was treated for her right breast cancer by Dr. Scherrie Curt in 2000. She completed adjuvant Femara in June 2010.   On exam today patient is accompanied by her spouse and daughter who joined via speaker phone. Patient reports she has been experiencing right shoulder pain since March 2025, which began gradually as a mild, dull ache, particularly noticeable when carrying her purse. Initially intermittent, the pain became constant and more bothersome by May 2025, evolving into a stabbing sensation that worsens with movement or sitting. The pain has since radiated to the side of her neck, causing difficulty in turning her head. No numbness, tingling, or swelling  in the right arm, and no history of injury to the shoulder. Although prescribed tramadol, she has not yet taken it, reserving it for severe pain. She uses a heat pad for temporary relief. 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  Age related cancer screenings include colonoscopy 2023, last mammogram in  07/2023 and most recent pap smear in 2016. Oncologic family history includes her father had prostate cancer, maternal grandmother had uterine cancer, and her maternal uncle had colon cancer. Patient is a never smoker and does not drink alcohol. She is retired and previously worked as a Manufacturing systems engineer.   MEDICAL HISTORY:  Past Medical History:  Diagnosis Date   Breast cancer (HCC)    Hyperlipidemia     SURGICAL HISTORY: No past surgical history on file.  SOCIAL HISTORY: Social History   Socioeconomic History   Marital status: Married    Spouse name: Not on file   Number of children: Not on file   Years of education: Not on file   Highest education level: Not on file  Occupational History   Not on file  Tobacco Use   Smoking status: Not on file   Smokeless tobacco: Not on file  Substance and Sexual Activity   Alcohol use: Not on file   Drug use: Not on file   Sexual activity: Not on file  Other Topics Concern   Not on file  Social History Narrative   Not on file   Social Drivers of Health   Financial Resource Strain: Not on file  Food Insecurity: No Food Insecurity (06/02/2024)   Hunger Vital Sign    Worried About Running Out of Food in the  Last Year: Never true    Ran Out of Food in the Last Year: Never true  Transportation Needs: No Transportation Needs (06/02/2024)   PRAPARE - Administrator, Civil Service (Medical): No    Lack of Transportation (Non-Medical): No  Physical Activity: Not on file  Stress: Not on file  Social Connections: Not on file  Intimate  Partner Violence: Not At Risk (06/02/2024)   Humiliation, Afraid, Rape, and Kick questionnaire    Fear of Current or Ex-Partner: No    Emotionally Abused: No    Physically Abused: No    Sexually Abused: No    FAMILY HISTORY: No family history on file.  ALLERGIES:  is allergic to adhesive [tape], amoxicillin, azithromycin, clarithromycin, doxycycline, latex, levsin [hyoscyamine], meperidine and related, and sulfa antibiotics.  MEDICATIONS:  Current Outpatient Medications  Medication Sig Dispense Refill   Calcium Carbonate-Vit D-Min (CALCIUM 1200 PO) Take 1,200 mg by mouth daily.     EPINEPHrine 0.3 mg/0.3 mL IJ SOAJ injection SMARTSIG:0.3 Milligram(s) IM Once PRN     guaiFENesin (MUCINEX) 600 MG 12 hr tablet Take 1,200 mg by mouth 2 (two) times daily as needed.     ketotifen (ALAWAY) 0.025 % ophthalmic solution Place 1 drop into both eyes 2 (two) times daily as needed.     loratadine (CLARITIN) 10 MG tablet Take 10 mg by mouth daily as needed for allergies.     Multiple Vitamin (MULTIVITAMIN) tablet Take 1 tablet by mouth daily.     Polyethyl Glycol-Propyl Glycol (SYSTANE) 0.4-0.3 % SOLN Apply to eye as needed. Dry eyes     VITAMIN D, ERGOCALCIFEROL, PO Take 2,800 Units by mouth daily.     No current facility-administered medications for this visit.    REVIEW OF SYSTEMS:   All other systems are reviewed and are negative for acute change except as noted in the HPI.  PHYSICAL EXAMINATION: ECOG PERFORMANCE STATUS: 1 - Symptomatic but completely ambulatory  Vitals:   06/02/24 1218  BP: 130/81  Pulse: 65  Resp: 18  Temp: 98.1 F (36.7 C)  SpO2: 99%   Filed Weights   06/02/24 1218  Weight: 140 lb 6 oz (63.7 kg)    Physical Exam Vitals reviewed.  Constitutional:      Appearance: She is not ill-appearing or toxic-appearing.  HENT:     Head: Normocephalic.     Nose: Nose normal.   Eyes:     General: No scleral icterus.    Conjunctiva/sclera: Conjunctivae normal.    Neck:     Comments: Full ROM intact without spinous process TTP. No bony stepoffs or deformities, no paraspinous muscle TTP or muscle spasms. No rigidity or meningeal signs. No bruising, erythema, or swelling. Cardiovascular:     Rate and Rhythm: Normal rate and regular rhythm.     Pulses: Normal pulses.          Radial pulses are 2+ on the left side.     Heart sounds: Normal heart sounds.  Pulmonary:     Effort: Pulmonary effort is normal.     Breath sounds: Normal breath sounds.  Abdominal:     General: There is no distension.   Musculoskeletal:     Cervical back: Normal range of motion.     Comments: Bony tenderness to right shoulder without obvious deformity. Decreased ROM secondary to pain with right shoulder flexion. Compartments are soft in RUE. Equal grip strength in BUE   Skin:    General: Skin is warm and dry.  Capillary Refill: Capillary refill takes less than 2 seconds.   Neurological:     Mental Status: She is alert.       LABORATORY DATA:  I have reviewed the data as listed    Latest Ref Rng & Units 06/02/2024    2:09 PM  CBC  WBC 4.0 - 10.5 K/uL 5.2   Hemoglobin 12.0 - 15.0 g/dL 69.6   Hematocrit 29.5 - 46.0 % 40.3   Platelets 150 - 400 K/uL 279        Latest Ref Rng & Units 06/02/2024    2:09 PM  CMP  Glucose 70 - 99 mg/dL 94   BUN 8 - 23 mg/dL 11   Creatinine 2.84 - 1.00 mg/dL 1.32   Sodium 440 - 102 mmol/L 135   Potassium 3.5 - 5.1 mmol/L 4.1   Chloride 98 - 111 mmol/L 99   CO2 22 - 32 mmol/L 30   Calcium 8.9 - 10.3 mg/dL 9.4   Total Protein 6.5 - 8.1 g/dL 7.1   Total Bilirubin 0.0 - 1.2 mg/dL 1.4   Alkaline Phos 38 - 126 U/L 69   AST 15 - 41 U/L 13   ALT 0 - 44 U/L 14      RADIOGRAPHIC STUDIES: I have personally reviewed the radiological images as listed and agreed with the findings in the report. MR CERVICAL SPINE W WO CONTRAST Result Date: 05/29/2024 CLINICAL DATA:  Right neck and shoulder pain. EXAM: MRI CERVICAL SPINE WITHOUT  AND WITH CONTRAST TECHNIQUE: Multiplanar and multiecho pulse sequences of the cervical spine, to include the craniocervical junction and cervicothoracic junction, were obtained without and with intravenous contrast. CONTRAST:  7 mL Vueway  COMPARISON:  None Available. FINDINGS: Alignment: Slight degenerative retrolisthesis is present at C5-6. No other significant listhesis is present. Cervical lordosis is maintained. Vertebrae: Diffuse T2 hyperintensity is present within the left posterior elements at C3, including the pedicle. A 12 mm T2 hyperintense lesion is present within the right transverse process of T1. A 4 mm hemangioma is present at C5. A 5 mm hemangioma is present at T2. Marrow signal and vertebral body heights are normal. Cord: Normal signal and morphology. Posterior Fossa, vertebral arteries, paraspinal tissues: Craniocervical junction is normal. Flow is present in the vertebral arteries bilaterally. Visualized intracranial contents are normal. Disc levels: C2-3: Negative. C3-4: Bilateral facet hypertrophy is present. Minimal uncovertebral spurring present. Mild foraminal narrowing is present bilaterally. C4-5: Mild right foraminal narrowing is secondary to uncovertebral and facet hypertrophy. Facet hypertrophy is worse on the left. C5-6: Broad-based disc osteophyte complex partially effaces the ventral CSF. Uncovertebral spurring leads to moderate foraminal narrowing, right greater than left. C6-7: A shallow central disc protrusion is present. Mild left foraminal stenosis is secondary to uncovertebral spurring. C7-T1: Negative. IMPRESSION: 1. Diffuse T2 hyperintensity within the left posterior elements at C3, including the pedicle. This is concerning for focal metastatic disease or myeloma. 2. 12 mm T2 hyperintense lesion within the right transverse process of T1. This is concerning for focal metastatic disease or myeloma. 3. Mild foraminal narrowing bilaterally at C3-4. 4. Mild right foraminal  narrowing at C4-5. 5. Moderate foraminal narrowing bilaterally at C5-6, right greater than left. 6. Mild left foraminal stenosis at C6-7. Electronically Signed   By: Audree Leas M.D.   On: 05/29/2024 17:19   MR SHOULDER RIGHT W WO CONTRAST Result Date: 05/29/2024 CLINICAL DATA:  Right shoulder pain since February 2024. EXAM: MRI OF THE RIGHT SHOULDER WITHOUT AND WITH CONTRAST TECHNIQUE: Multiplanar,  multisequence MR imaging of the right shoulder was performed before and after the administration of intravenous contrast. CONTRAST:  7 cc Vueway  COMPARISON:  None Available. FINDINGS: Rotator cuff: Small full thickness retracted supraspinatus tendon tear from its footprint attachment site with maximum retraction of 9 mm. The tear is approximately 9 mm wide also. The infraspinatus and subscapularis tendons are intact. Muscles:  No significant findings. Biceps long head:  Intact Acromioclavicular Joint: Mild degenerative changes. Type 2 acromion. Moderate lateral downsloping and mild undersurface spurring. Glenohumeral Joint: Intact articular cartilage. No joint effusion. There is thickening of the capsular structures in the axillary recess which can be seen with adhesive capsulitis or synovitis. Labrum:  No definite labral tears. Bones:  No acute bony findings. Other: Expected fluid in the subacromial/subdeltoid bursa. IMPRESSION: 1. Small full thickness, partial width retracted supraspinatus tendon tear from its footprint attachment site. 2. Intact long head biceps tendon and glenoid labrum. 3. There is thickening of the capsular structures in the axillary recess which can be seen with adhesive capsulitis or synovitis. 4. Mild AC joint degenerative changes and moderate lateral downsloping and mild undersurface spurring from a type 2 acromion. Electronically Signed   By: Marrian Siva M.D.   On: 05/29/2024 16:54    ASSESSMENT & PLAN Rachel Stephens is a 73 y.o. female presenting [facility] for consultation regarding bone lesion concerning for metastatic disease vs multiple myeloma. Patient will proceed with laboratory workup today.   #Bone lesion - Reviewed MRI results with patient. - Will check labs today CBC, CMP, LDH, multiple myeloma panel, serum free light chains. Will also check flow cytometry given recent mycosis fungiodes from rash biopsy. Pending lab results will consider bone scan vs CT CAP for further workup. - Patient has tramadol for severe pain if needed. I can refill prescription in the future if needed. She plans to continue tylenol and a heating pad for pain management for now.  #History of breast cancer -Treated with AC chemotherapy, 5 years of tamoxifen and 5 years of Letrozole per patient. Unable to see records in Epic. - Last mammogram in 07/2023 was negative for malignancy. Will check breast tumor markers today given her history.   #Age related screenings - UTD  -Patient will RTC when work up is complete.  Patient expressed understanding of the recommended workup and is agreeable to move forward.   All questions were answered. The patient knows to call the clinic with any problems, questions or concerns.  Shared visit with Dr. Rosaline Coma.  Orders Placed This Encounter  Procedures   CA 15.3    Standing Status:   Future    Number of Occurrences:   1    Expiration Date:   06/01/2025   CA 27.29    Standing Status:   Future    Number of Occurrences:   1    Expiration Date:   06/01/2025   CBC with Differential (Cancer Center Only)   CMP (Cancer Center only)   Kappa/lambda light chains   Lactate dehydrogenase   Multiple Myeloma Panel (SPEP&IFE w/QIG)   Flow Cytometry, Peripheral Blood (Oncology)      I have spent a total of 60 minutes minutes of face-to-face and non-face-to-face time, preparing to see the patient, obtaining and/or reviewing separately obtained history, performing a medically appropriate examination, counseling and educating the  patient, ordering medications/tests/procedures, referring and communicating with other health care professionals, documenting clinical information in the electronic health record, independently interpreting results and communicating  results to the patient, and care coordination.   Meleah Demeyer Walisiewicz PA-C Department of Hematology/Oncology [facility] at [facility] Phone: [phone]  I have read the above note and personally examined the patient. I agree with the assessment and plan as noted above.  Briefly Mrs. Zofia Peckinpaugh is a 73 year old female who presents for evaluation of a concerning lesion noted on her cervical spine.  The patient has been having shoulder pain over the last several months.  An MRI of the shoulder and cervical spine were ordered on 05/29/2024.  The MRI of the shoulder showed a small full-thickness partial width retracted supraspinatus tear.  This helps to explain the shoulder pain, however on the C-spine imaging she was found to have a diffuse T2 hyperintensity within the left posterior elements of C3 including the pedicle.  This was concerning for focal metastatic disease versus myeloma.  Due to this finding the patient was referred to our service for further evaluation and management.  The patient has not undergone any surgical intervention for the torn supraspinatus given the workup for this lesion.  She notes she is not having bone pain or symptoms elsewhere in her body.  She is deeply nervous because she has had melanomas removed from her skin before and does have a past history significant for breast cancer treated by Dr. Scherrie Curt approximately 10 years ago.  At this time I would recommend serology workup with multiple myeloma panel and kappa/lambda light chains.  Would also recommend ordering breast tumor markers.  In the event that this does not give us  a clear etiology would recommend performing a nuclear medicine bone scan with consideration of a CT  chest abdomen pelvis to assess for a primary site.  If the lesion is not avid on the bone scan would recommend monitoring as this less likely represents a malignancy.  The patient voiced understanding of our findings and plan moving forward.   Rogerio Clay, MD Department of Hematology/Oncology [facility] at [facility] Phone: [phone] Pager: [phone] Email: [email or web address]

## 2024-06-02 ENCOUNTER — Inpatient Hospital Stay: Attending: Physician Assistant | Admitting: Physician Assistant

## 2024-06-02 ENCOUNTER — Inpatient Hospital Stay

## 2024-06-02 ENCOUNTER — Encounter: Payer: Self-pay | Admitting: Medical Oncology

## 2024-06-02 VITALS — BP 130/81 | HR 65 | Temp 98.1°F | Resp 18 | Wt 140.4 lb

## 2024-06-02 DIAGNOSIS — Z8049 Family history of malignant neoplasm of other genital organs: Secondary | ICD-10-CM | POA: Diagnosis not present

## 2024-06-02 DIAGNOSIS — J45909 Unspecified asthma, uncomplicated: Secondary | ICD-10-CM | POA: Diagnosis not present

## 2024-06-02 DIAGNOSIS — M899 Disorder of bone, unspecified: Secondary | ICD-10-CM | POA: Diagnosis not present

## 2024-06-02 DIAGNOSIS — E559 Vitamin D deficiency, unspecified: Secondary | ICD-10-CM | POA: Diagnosis not present

## 2024-06-02 DIAGNOSIS — E7800 Pure hypercholesterolemia, unspecified: Secondary | ICD-10-CM | POA: Diagnosis not present

## 2024-06-02 DIAGNOSIS — Z853 Personal history of malignant neoplasm of breast: Secondary | ICD-10-CM | POA: Diagnosis not present

## 2024-06-02 DIAGNOSIS — Z800 Family history of malignant neoplasm of digestive organs: Secondary | ICD-10-CM | POA: Insufficient documentation

## 2024-06-02 DIAGNOSIS — C50919 Malignant neoplasm of unspecified site of unspecified female breast: Secondary | ICD-10-CM | POA: Diagnosis not present

## 2024-06-02 LAB — LACTATE DEHYDROGENASE: LDH: 127 U/L (ref 98–192)

## 2024-06-02 LAB — CBC WITH DIFFERENTIAL (CANCER CENTER ONLY)
Abs Immature Granulocytes: 0.01 10*3/uL (ref 0.00–0.07)
Basophils Absolute: 0 10*3/uL (ref 0.0–0.1)
Basophils Relative: 0 %
Eosinophils Absolute: 0.1 10*3/uL (ref 0.0–0.5)
Eosinophils Relative: 2 %
HCT: 40.3 % (ref 36.0–46.0)
Hemoglobin: 14.1 g/dL (ref 12.0–15.0)
Immature Granulocytes: 0 %
Lymphocytes Relative: 22 %
Lymphs Abs: 1.1 10*3/uL (ref 0.7–4.0)
MCH: 29.1 pg (ref 26.0–34.0)
MCHC: 35 g/dL (ref 30.0–36.0)
MCV: 83.3 fL (ref 80.0–100.0)
Monocytes Absolute: 0.3 10*3/uL (ref 0.1–1.0)
Monocytes Relative: 5 %
Neutro Abs: 3.7 10*3/uL (ref 1.7–7.7)
Neutrophils Relative %: 71 %
Platelet Count: 279 10*3/uL (ref 150–400)
RBC: 4.84 MIL/uL (ref 3.87–5.11)
RDW: 12.6 % (ref 11.5–15.5)
WBC Count: 5.2 10*3/uL (ref 4.0–10.5)
nRBC: 0 % (ref 0.0–0.2)

## 2024-06-02 LAB — CMP (CANCER CENTER ONLY)
ALT: 14 U/L (ref 0–44)
AST: 13 U/L — ABNORMAL LOW (ref 15–41)
Albumin: 4.7 g/dL (ref 3.5–5.0)
Alkaline Phosphatase: 69 U/L (ref 38–126)
Anion gap: 6 (ref 5–15)
BUN: 11 mg/dL (ref 8–23)
CO2: 30 mmol/L (ref 22–32)
Calcium: 9.4 mg/dL (ref 8.9–10.3)
Chloride: 99 mmol/L (ref 98–111)
Creatinine: 0.61 mg/dL (ref 0.44–1.00)
GFR, Estimated: >60 mL/min (ref >60)
Glucose, Bld: 94 mg/dL (ref 70–99)
Potassium: 4.1 mmol/L (ref 3.5–5.1)
Sodium: 135 mmol/L (ref 135–145)
Total Bilirubin: 1.4 mg/dL — ABNORMAL HIGH (ref 0.0–1.2)
Total Protein: 7.1 g/dL (ref 6.5–8.1)

## 2024-06-02 NOTE — Patient Instructions (Signed)
 Diagnostic Clinic Office Visit Discharge Information and Instructions  Thank you for choosing [facility] for your healthcare needs.  Below is a summary of today's discussion, along with our contact information and an outline of what to expect next.  Reason for Visit:  abnormal MRI  Proposed Diagnostic Care Plan: Labs collected today. Your lab results will help us  determine next steps. I will call you once I have all of the lab results to discuss them. The multiple myeloma panel can take several days to result.   What to Expect: - Generally, when lab tests are ordered the results can take up to 1 week for results to be available.  At that point, we will contact you to discuss your results with you.  Unless there is a critical result, we will typically wait for all of your lab results to be available before contacting you. - If a biopsy is part of your Care Plan, those results can take on average 7-10 days to result.  Once results are available, we will contact you to discuss your pathology results and any next steps. - If you have additional imaging ordered, such as a CT Scan, MRI, Ultrasound, Bone Scan, or PET scan, your imaging will need to be authorized then scheduled with the earliest available appointment.  You may be asked to travel to another hospital within [facility] who has a sooner availability, please consider doing so if asked. - If you use MyChart, your results will be available to you in the MyChart portal.  Your provider will be in touch with you as soon as all of your results are available to be discussed.  Your Diagnostic Clinic Provider:  Montrail Mehrer Walisiewicz PA-C and Dr. Rosaline Coma Your Diagnostic Navigator:  Jetta Morrow RN, office number [phone]  If you or your caregiver have number blocking on your cell phones, please ensure the cancer center's numbers are not blocked.  If you are not a registered MyChart user, please consider enrolling in MyChart to receive your  test results and visit notes.  You can also access your discharge instructions electronically.  MyChart also gives you an electronic means to communicate with your Care Team instead of needing to call in to the cancer center.  We appreciate you trusting us  with your healthcare and look forward to partnering with you as we work to uncover what your potential diagnosis may be.  Please do not hesitate to reach out at any point with questions or concerns.

## 2024-06-02 NOTE — Progress Notes (Signed)
 [facility]  Patient presented to clinic, with her husband, for her scheduled appointment with Rosalind Columbia, PA-C. I introduced myself and provided them with my direct contact information. Patient and spouse were both encouraged to call me with any questions/concerns they may have.  Esperanza Hedges, RN, BSN, [facility] Oncology Nurse Navigator, [facility] 06/02/2024 3:58 PM

## 2024-06-03 LAB — CANCER ANTIGEN 27.29: CA 27.29: 12 U/mL (ref 0.0–38.6)

## 2024-06-03 LAB — KAPPA/LAMBDA LIGHT CHAINS
Kappa free light chain: 8.2 mg/L (ref 3.3–19.4)
Kappa, lambda light chain ratio: 1.34 (ref 0.26–1.65)
Lambda free light chains: 6.1 mg/L (ref 5.7–26.3)

## 2024-06-03 LAB — CANCER ANTIGEN 15-3: CA 15-3: 13.9 U/mL (ref 0.0–25.0)

## 2024-06-07 ENCOUNTER — Telehealth: Payer: Self-pay | Admitting: Physician Assistant

## 2024-06-07 DIAGNOSIS — M899 Disorder of bone, unspecified: Secondary | ICD-10-CM

## 2024-06-07 NOTE — Telephone Encounter (Signed)
 I notified Rachel Stephens by phone regarding lab results after discussing with Dr. Rosaline Coma. Breast tumor markers are WNL. Multiple myeloma panel does not show evidence of monoclonal protein. We recommend a nuclear medicine bone scan for further evaluation of the diffuse T2 hyperintensity within the left posterior elements at C3 and hyperintense lesion of T1 previously seen on MR cervical spine. Scan has been ordered.  All of patient's questions were answered and she expressed understanding of the plan provided.

## 2024-06-08 LAB — SURGICAL PATHOLOGY

## 2024-06-09 ENCOUNTER — Encounter (HOSPITAL_COMMUNITY)
Admission: RE | Admit: 2024-06-09 | Discharge: 2024-06-09 | Disposition: A | Discharge status: Home / Self Care | Source: Ambulatory Visit | Attending: Physician Assistant | Admitting: Physician Assistant

## 2024-06-09 ENCOUNTER — Encounter: Payer: Self-pay | Admitting: Medical Oncology

## 2024-06-09 DIAGNOSIS — C50919 Malignant neoplasm of unspecified site of unspecified female breast: Secondary | ICD-10-CM | POA: Diagnosis not present

## 2024-06-09 DIAGNOSIS — M899 Disorder of bone, unspecified: Secondary | ICD-10-CM | POA: Insufficient documentation

## 2024-06-09 DIAGNOSIS — C439 Malignant melanoma of skin, unspecified: Secondary | ICD-10-CM | POA: Diagnosis not present

## 2024-06-09 LAB — FLOW CYTOMETRY

## 2024-06-09 MED ORDER — TECHNETIUM TC 99M MEDRONATE IV KIT
20.0000 | PACK | Freq: Once | INTRAVENOUS | Status: AC | PRN
  Administered 2024-06-09: 20 via INTRAVENOUS

## 2024-06-11 ENCOUNTER — Encounter: Payer: Self-pay | Admitting: Medical Oncology

## 2024-06-11 ENCOUNTER — Telehealth: Payer: Self-pay | Admitting: Physician Assistant

## 2024-06-11 DIAGNOSIS — M899 Disorder of bone, unspecified: Secondary | ICD-10-CM

## 2024-06-11 NOTE — Telephone Encounter (Signed)
 I notified Rachel Stephens by phone regarding bone scan results. Bone scan does not show focal uptake corresponding to the increased T2 signal within the right T1 transverse process or the left C3 pedicle and posterior elements. Dr. Rosaline Coma discussed results with the radiologist. Per the radiologist the recommendation is for repeat MRI cervical spine without contrast in 4 months for monitoring. MRI cervical spine has been ordered with an expected date of 10/11/2024. Patient will be scheduled to follow up here at the [facility] afterwards to discuss results. All of patient's questions were answered and she expressed understanding of the plan provided. Patient knows to call sooner if she develops any concerning symptoms.

## 2024-06-14 DIAGNOSIS — M542 Cervicalgia: Secondary | ICD-10-CM | POA: Diagnosis not present

## 2024-06-14 DIAGNOSIS — M25511 Pain in right shoulder: Secondary | ICD-10-CM | POA: Diagnosis not present

## 2024-06-30 DIAGNOSIS — D1809 Hemangioma of other sites: Secondary | ICD-10-CM | POA: Diagnosis not present

## 2024-07-01 ENCOUNTER — Telehealth: Payer: Self-pay | Admitting: Physician Assistant

## 2024-07-01 ENCOUNTER — Encounter: Payer: Self-pay | Admitting: Medical Oncology

## 2024-07-01 DIAGNOSIS — M25511 Pain in right shoulder: Secondary | ICD-10-CM | POA: Diagnosis not present

## 2024-07-01 NOTE — Progress Notes (Signed)
 [facility]  Incoming call from patient requesting to have a PET scan. Patient states that she is aware that bone scan was negative but that she would feel better to have a PET to make sure nothing was missed. Informed patient that she is scheduled for an MRI in October 2025 for follow-up. Patient states she is aware of the MRI appointment and that she would feel better having a PET. Patient informed that I will relay her request to Dr. Federico and PA-C Mallie Combes. Patient knows to expect call before the end of the day regarding her request. Patient denied further questions at this time and was encouraged to call with further questions/concerns.   Secure chat sent to MD and PA-C for review.   Colene KYM Raider, RN, BSN, [facility] Oncology Nurse Navigator, [facility] 07/01/2024 1:39 PM

## 2024-07-01 NOTE — Telephone Encounter (Signed)
 I contacted Rachel Stephens by phone to follow up on her request for a PET scan. Per discussion with Dr. Federico a PET scan would not provide additional information that would be helpful when compared to the nuclear medicine bone scan she had on 06/09/24. We still recommend the plan for a repeat MRI of cervical spine in October 2025 to monitor the original findings of diffuse T2 hyperintensity within the left posterior elements at C3 and the 12 mm T2 hyperintense lesion within the right transverse process of T1. We can consider a CT of chest, abdomen, and pelvis to evaluate for systemic disease although that is unlikely based on her reassuring work up thus far. Patient is asking for time to think about the CT scan and will call back with her answer. She has no additional questions at this time.

## 2024-07-02 DIAGNOSIS — M25511 Pain in right shoulder: Secondary | ICD-10-CM | POA: Diagnosis not present

## 2024-07-05 DIAGNOSIS — M25511 Pain in right shoulder: Secondary | ICD-10-CM | POA: Diagnosis not present

## 2024-07-08 DIAGNOSIS — M25511 Pain in right shoulder: Secondary | ICD-10-CM | POA: Diagnosis not present

## 2024-07-12 DIAGNOSIS — M25511 Pain in right shoulder: Secondary | ICD-10-CM | POA: Diagnosis not present

## 2024-07-13 ENCOUNTER — Encounter: Payer: Self-pay | Admitting: Medical Oncology

## 2024-07-13 ENCOUNTER — Other Ambulatory Visit: Payer: Self-pay | Admitting: Physician Assistant

## 2024-07-13 DIAGNOSIS — M899 Disorder of bone, unspecified: Secondary | ICD-10-CM

## 2024-07-13 DIAGNOSIS — Z853 Personal history of malignant neoplasm of breast: Secondary | ICD-10-CM

## 2024-07-13 NOTE — Progress Notes (Signed)
 Informed by RN that patient would like to proceed with CT CAP as previously discussed. Scan ordered and I will contact patients with the results.

## 2024-07-13 NOTE — Progress Notes (Signed)
 [facility]  Patient called wanting to inform PA-C Mallie that she would like to have the CT scan that they discussed 07/01/24. Patient also wanted to speak with Mallie and get her opinion on having an US  of her right breast, since she has a mastectomy in this breast. Patient informed that I will send message to Mallie of her request. Patient gave her understanding and thanked me. Patient encouraged to call with further questions/concerns.   Secure chat sent to [place].  Colene KYM Raider, RN, BSN, [facility] Oncology Nurse Navigator, [facility] 07/13/2024 3:26 PM

## 2024-07-13 NOTE — Progress Notes (Signed)
 [facility]  Call to patient to inform her that CT orders have been placed. Patient provided with central scheduling phone number to schedule appointment for her convenience. Patient also informed that once CT results, Mallie will call her with results and will answer any questions related to her question about an right breast US . Patient gave her verbal understanding and thanks. Patient encouraged to call with further questions/concerns.   Colene KYM Raider, RN, BSN, [facility] Oncology Nurse Navigator, [facility] 07/13/2024 4:22 PM

## 2024-07-15 DIAGNOSIS — C8400 Mycosis fungoides, unspecified site: Secondary | ICD-10-CM | POA: Diagnosis not present

## 2024-07-15 DIAGNOSIS — L986 Other infiltrative disorders of the skin and subcutaneous tissue: Secondary | ICD-10-CM | POA: Diagnosis not present

## 2024-07-19 DIAGNOSIS — M25511 Pain in right shoulder: Secondary | ICD-10-CM | POA: Diagnosis not present

## 2024-07-22 ENCOUNTER — Ambulatory Visit (HOSPITAL_COMMUNITY)
Admission: RE | Admit: 2024-07-22 | Discharge: 2024-07-22 | Disposition: A | Discharge status: Home / Self Care | Source: Ambulatory Visit | Attending: Physician Assistant | Admitting: Physician Assistant

## 2024-07-22 DIAGNOSIS — M899 Disorder of bone, unspecified: Secondary | ICD-10-CM | POA: Diagnosis not present

## 2024-07-22 DIAGNOSIS — Z9049 Acquired absence of other specified parts of digestive tract: Secondary | ICD-10-CM | POA: Diagnosis not present

## 2024-07-22 DIAGNOSIS — Z85820 Personal history of malignant melanoma of skin: Secondary | ICD-10-CM | POA: Diagnosis not present

## 2024-07-22 DIAGNOSIS — Z853 Personal history of malignant neoplasm of breast: Secondary | ICD-10-CM | POA: Insufficient documentation

## 2024-07-22 DIAGNOSIS — Z9889 Other specified postprocedural states: Secondary | ICD-10-CM | POA: Insufficient documentation

## 2024-07-22 DIAGNOSIS — I700 Atherosclerosis of aorta: Secondary | ICD-10-CM | POA: Diagnosis not present

## 2024-07-22 DIAGNOSIS — C7951 Secondary malignant neoplasm of bone: Secondary | ICD-10-CM | POA: Diagnosis present

## 2024-07-22 DIAGNOSIS — K8689 Other specified diseases of pancreas: Secondary | ICD-10-CM | POA: Diagnosis not present

## 2024-07-22 MED ORDER — SODIUM CHLORIDE (PF) 0.9 % IJ SOLN
INTRAMUSCULAR | Status: AC
  Filled 2024-07-22: qty 50

## 2024-07-22 MED ORDER — IOHEXOL 300 MG/ML  SOLN
80.0000 mL | Freq: Once | INTRAMUSCULAR | Status: AC | PRN
  Administered 2024-07-22: 80 mL via INTRAVENOUS

## 2024-08-02 ENCOUNTER — Telehealth: Payer: Self-pay | Admitting: Physician Assistant

## 2024-08-02 ENCOUNTER — Encounter: Payer: Self-pay | Admitting: Oncology

## 2024-08-02 ENCOUNTER — Encounter: Payer: Self-pay | Admitting: Medical Oncology

## 2024-08-02 NOTE — Telephone Encounter (Signed)
 I notified Rachel Stephens by phone regarding CT CAP results. The CT scan shows no acute intrathoracic, intraabdominal, or intrapelvic abnormality. Dr. Federico was updated on results and agrees with plan for repeat MR cervical spine for monitoring as previously discussed. Discussed with patient that image does not show any chest wall abnormalities and no enlarged mediastinal, hilar, or axillary lymph nodes. An ultrasound of her chest is not warranted to evaluate for breast caner recurrence. Patient did have labs on 06/02/24 during her initial work up including CA 15-3 and CA 27.9 that were WNL. Patient will follow up after MRI cervical spine to discuss results. All of patient's questions were answered and she expressed understanding of the plan provided.

## 2024-08-05 DIAGNOSIS — M25511 Pain in right shoulder: Secondary | ICD-10-CM | POA: Diagnosis not present

## 2024-08-09 ENCOUNTER — Encounter: Payer: Self-pay | Admitting: Medical Oncology

## 2024-08-09 NOTE — Progress Notes (Signed)
 [facility]  Patient called inquiring if she needs a US  of right breast. Patient reminded of her conversation with Mallie regarding CT imaging results, documented on 08/02/24. Patient gave her verbal understanding. Patient scheduled for follow up with DEVONNA Mallie, on 10/22 for review on MRI. Patient thanked and encouraged to call with questions/concerns.   Colene KYM Raider, RN, BSN, [facility] Oncology Nurse Navigator, [facility] 08/09/2024 4:09 PM

## 2024-08-10 DIAGNOSIS — M25511 Pain in right shoulder: Secondary | ICD-10-CM | POA: Diagnosis not present

## 2024-08-11 DIAGNOSIS — Z1231 Encounter for screening mammogram for malignant neoplasm of breast: Secondary | ICD-10-CM | POA: Diagnosis not present

## 2024-08-13 DIAGNOSIS — C8400 Mycosis fungoides, unspecified site: Secondary | ICD-10-CM | POA: Diagnosis not present

## 2024-08-19 DIAGNOSIS — M25511 Pain in right shoulder: Secondary | ICD-10-CM | POA: Diagnosis not present

## 2024-08-27 DIAGNOSIS — M25511 Pain in right shoulder: Secondary | ICD-10-CM | POA: Diagnosis not present

## 2024-09-02 DIAGNOSIS — M25511 Pain in right shoulder: Secondary | ICD-10-CM | POA: Diagnosis not present

## 2024-09-09 DIAGNOSIS — M25511 Pain in right shoulder: Secondary | ICD-10-CM | POA: Diagnosis not present

## 2024-09-21 DIAGNOSIS — C8400 Mycosis fungoides, unspecified site: Secondary | ICD-10-CM | POA: Diagnosis not present

## 2024-09-21 DIAGNOSIS — L986 Other infiltrative disorders of the skin and subcutaneous tissue: Secondary | ICD-10-CM | POA: Diagnosis not present

## 2024-09-21 DIAGNOSIS — R21 Rash and other nonspecific skin eruption: Secondary | ICD-10-CM | POA: Diagnosis not present

## 2024-09-27 DIAGNOSIS — D3617 Benign neoplasm of peripheral nerves and autonomic nervous system of trunk, unspecified: Secondary | ICD-10-CM | POA: Diagnosis not present

## 2024-09-27 DIAGNOSIS — L821 Other seborrheic keratosis: Secondary | ICD-10-CM | POA: Diagnosis not present

## 2024-09-27 DIAGNOSIS — I8391 Asymptomatic varicose veins of right lower extremity: Secondary | ICD-10-CM | POA: Diagnosis not present

## 2024-09-27 DIAGNOSIS — L738 Other specified follicular disorders: Secondary | ICD-10-CM | POA: Diagnosis not present

## 2024-09-27 DIAGNOSIS — Z85820 Personal history of malignant melanoma of skin: Secondary | ICD-10-CM | POA: Diagnosis not present

## 2024-09-27 DIAGNOSIS — D1801 Hemangioma of skin and subcutaneous tissue: Secondary | ICD-10-CM | POA: Diagnosis not present

## 2024-09-27 DIAGNOSIS — L309 Dermatitis, unspecified: Secondary | ICD-10-CM | POA: Diagnosis not present

## 2024-09-27 DIAGNOSIS — D2262 Melanocytic nevi of left upper limb, including shoulder: Secondary | ICD-10-CM | POA: Diagnosis not present

## 2024-09-27 DIAGNOSIS — L570 Actinic keratosis: Secondary | ICD-10-CM | POA: Diagnosis not present

## 2024-09-27 DIAGNOSIS — D2272 Melanocytic nevi of left lower limb, including hip: Secondary | ICD-10-CM | POA: Diagnosis not present

## 2024-09-28 DIAGNOSIS — R051 Acute cough: Secondary | ICD-10-CM | POA: Diagnosis not present

## 2024-09-28 DIAGNOSIS — H6123 Impacted cerumen, bilateral: Secondary | ICD-10-CM | POA: Diagnosis not present

## 2024-09-28 DIAGNOSIS — R9389 Abnormal findings on diagnostic imaging of other specified body structures: Secondary | ICD-10-CM | POA: Diagnosis not present

## 2024-10-05 DIAGNOSIS — C8400 Mycosis fungoides, unspecified site: Secondary | ICD-10-CM | POA: Diagnosis not present

## 2024-10-06 ENCOUNTER — Ambulatory Visit (HOSPITAL_COMMUNITY)
Admission: RE | Admit: 2024-10-06 | Discharge: 2024-10-06 | Disposition: A | Discharge status: Home / Self Care | Source: Ambulatory Visit | Attending: Physician Assistant | Admitting: Physician Assistant

## 2024-10-06 DIAGNOSIS — M899 Disorder of bone, unspecified: Secondary | ICD-10-CM

## 2024-10-08 DIAGNOSIS — L986 Other infiltrative disorders of the skin and subcutaneous tissue: Secondary | ICD-10-CM | POA: Diagnosis not present

## 2024-10-08 DIAGNOSIS — C8400 Mycosis fungoides, unspecified site: Secondary | ICD-10-CM | POA: Diagnosis not present

## 2024-10-13 ENCOUNTER — Encounter: Payer: Self-pay | Admitting: Physician Assistant

## 2024-10-13 ENCOUNTER — Inpatient Hospital Stay: Attending: Physician Assistant | Admitting: Physician Assistant

## 2024-10-13 VITALS — BP 112/65 | HR 81 | Temp 97.9°F | Resp 20 | Wt 144.8 lb

## 2024-10-13 DIAGNOSIS — E7800 Pure hypercholesterolemia, unspecified: Secondary | ICD-10-CM | POA: Diagnosis not present

## 2024-10-13 DIAGNOSIS — Z85820 Personal history of malignant melanoma of skin: Secondary | ICD-10-CM | POA: Insufficient documentation

## 2024-10-13 DIAGNOSIS — Z09 Encounter for follow-up examination after completed treatment for conditions other than malignant neoplasm: Secondary | ICD-10-CM

## 2024-10-13 DIAGNOSIS — E559 Vitamin D deficiency, unspecified: Secondary | ICD-10-CM | POA: Insufficient documentation

## 2024-10-13 NOTE — Progress Notes (Signed)
 [facility] for Malignancy  Hand-off Note  10/13/24 4:17 PM  Rachel Stephens 1951/12/14 [id number]  Cancer Care, Care Team: [facility] PA-C Selinda An, RN Diagnostic Nurse Navigator  Rachel Stephens was referred to Cancer Care on 06/11/24 for evaluation of:  neck and shoulder pain with an abnormal MRI.  The patient's diagnostic work-up included: 06/02/2024:  Multiple Myeloma Panel negative 06/09/2024:  Bone Scan 07/22/2024:  CT CAP 10/06/2024:  MR Cspine  Patient had MR cervical spine on 10/06/24 for follow up of her abnormal cervical spine MRI that was performed on 05/29/24 to evaluate her right neck and shoulder pain.  - Reviewed MRI findings with Dr. Federico prior to visit.  Discussed with patient that MRI shows no changes, indicating stability.  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  Patient is asymptomatic and no further oncology workup is needed at this time.    The patient was found to not have malignancy at this time, as evaluated for the reason for referral stated above.  The recommended follow-up provided to the patient includes:  Reporting any new concerns with her PCP.  We thank you for allowing us  to assist in Rachel P Chalker's care.  The initial and most recent Progress Notes, labs, imaging, procedure(s), and/or consult notes have been routed to you through [facility] or faxed to your office for continuity of care.   Selinda An, RN Oncology Nurse Navigator, [facility]  10/13/2024 4:23 PM

## 2024-10-13 NOTE — Progress Notes (Signed)
 [facility] [phone]   [phone]   Patient Care Team: Chrystal Lamarr RAMAN, MD as PCP - General (Family Medicine)  CHIEF COMPLAINTS/PURPOSE OF CONSULTATION:  Follow up   HISTORY OF PRESENTING ILLNESS:  Rachel Stephens 73 y.o. female with medical history significant for  melanoma in situ in August 2019, allergic rhinitis, asthma, stage II right-sided breast cancer diagnosed in February 2000, pure hypercholesterolemia, vitamin D deficiency.   On exam today patient is accompanied by her spouse who provides additional history.  Patient states since her last visit in June she has been doing well.  She completed about approximately 10-12 physical therapy sessions for the pain in her right shoulder which has completely resolved.  She denies any weakness, numbness or tingling in her right arm.  Patient is very pleased that she no longer has the pain she had been experiencing.  In the interim she was evaluated by neurosurgeon Dr. Lanis and reports no additional work up was ordered, it was agreed to wait for results of her recent MRI cervical spine. Patient has no additional complaints today.    MEDICAL HISTORY:  Past Medical History:  Diagnosis Date   Breast cancer (HCC)    Hyperlipidemia     SURGICAL HISTORY: No past surgical history on file.  SOCIAL HISTORY: Social History   Socioeconomic History   Marital status: Married    Spouse name: Not on file   Number of children: Not on file   Years of education: Not on file   Highest education level: Not on file  Occupational History   Not on file  Tobacco Use   Smoking status: Not on file   Smokeless tobacco: Not on file  Substance and Sexual Activity   Alcohol use: Not on file   Drug use: Not on file   Sexual activity: Not on file  Other Topics Concern   Not on file  Social History Narrative   Not on file   Social Drivers of Health   Financial Resource  Strain: Not on file  Food Insecurity: No Food Insecurity (06/02/2024)   Hunger Vital Sign    Worried About Running Out of Food in the Last Year: Never true    Ran Out of Food in the Last Year: Never true  Transportation Needs: No Transportation Needs (06/02/2024)   PRAPARE - Administrator, Civil Service (Medical): No    Lack of Transportation (Non-Medical): No  Physical Activity: Not on file  Stress: Not on file  Social Connections: Not on file  Intimate Partner Violence: Not At Risk (06/02/2024)   Humiliation, Afraid, Rape, and Kick questionnaire    Fear of Current or Ex-Partner: No    Emotionally Abused: No    Physically Abused: No    Sexually Abused: No    FAMILY HISTORY: No family history on file.  ALLERGIES:  is allergic to adhesive [tape], amoxicillin, azithromycin, clarithromycin, doxycycline, latex, levsin [hyoscyamine], meperidine and related, and sulfa antibiotics.  MEDICATIONS:  Current Outpatient Medications  Medication Sig Dispense Refill   Calcium Carbonate-Vit D-Min (CALCIUM 1200 PO) Take 1,200 mg by mouth daily.     EPINEPHrine 0.3 mg/0.3 mL IJ SOAJ injection SMARTSIG:0.3 Milligram(s) IM Once PRN     guaiFENesin (MUCINEX) 600 MG 12 hr tablet Take 1,200 mg by mouth 2 (two) times daily as needed.     ketotifen (ALAWAY) 0.025 % ophthalmic solution Place 1 drop into both eyes 2 (two) times daily as needed.  loratadine (CLARITIN) 10 MG tablet Take 10 mg by mouth daily as needed for allergies.     Multiple Vitamin (MULTIVITAMIN) tablet Take 1 tablet by mouth daily.     Polyethyl Glycol-Propyl Glycol (SYSTANE) 0.4-0.3 % SOLN Apply to eye as needed. Dry eyes     VITAMIN D, ERGOCALCIFEROL, PO Take 2,800 Units by mouth daily.     No current facility-administered medications for this visit.    REVIEW OF SYSTEMS:   All other systems are reviewed and are negative for acute change except as noted in the HPI.  PHYSICAL EXAMINATION: ECOG PERFORMANCE STATUS: 0 -  Asymptomatic  Vitals:   10/13/24 1220  BP: 112/65  Pulse: 81  Resp: 20  Temp: 97.9 F (36.6 C)  SpO2: 96%   Filed Weights   10/13/24 1220  Weight: 144 lb 12.8 oz (65.7 kg)    Physical Exam Vitals reviewed.  Constitutional:      Appearance: She is not ill-appearing or toxic-appearing.  HENT:     Head: Normocephalic.     Nose: Nose normal.     Mouth/Throat:     Mouth: Mucous membranes are moist.  Eyes:     General: No scleral icterus.    Conjunctiva/sclera: Conjunctivae normal.  Cardiovascular:     Rate and Rhythm: Normal rate.  Pulmonary:     Effort: Pulmonary effort is normal.  Abdominal:     General: There is no distension.  Musculoskeletal:     Comments: Full ROM of right upper extremity. No bony tenderness.  Skin:    General: Skin is dry.  Neurological:     Comments: Normal strength in bilateral upper extremities. Sensation intact to bilateral upper extremities.       LABORATORY DATA:  I have reviewed the data as listed    Latest Ref Rng & Units 06/02/2024    2:09 PM  CBC  WBC 4.0 - 10.5 K/uL 5.2   Hemoglobin 12.0 - 15.0 g/dL 85.8   Hematocrit 63.9 - 46.0 % 40.3   Platelets 150 - 400 K/uL 279        Latest Ref Rng & Units 06/02/2024    2:09 PM  CMP  Glucose 70 - 99 mg/dL 94   BUN 8 - 23 mg/dL 11   Creatinine 9.55 - 1.00 mg/dL 9.38   Sodium 864 - 854 mmol/L 135   Potassium 3.5 - 5.1 mmol/L 4.1   Chloride 98 - 111 mmol/L 99   CO2 22 - 32 mmol/L 30   Calcium 8.9 - 10.3 mg/dL 9.4   Total Protein 6.5 - 8.1 g/dL 7.1   Total Bilirubin 0.0 - 1.2 mg/dL 1.4   Alkaline Phos 38 - 126 U/L 69   AST 15 - 41 U/L 13   ALT 0 - 44 U/L 14      RADIOGRAPHIC STUDIES: I have personally reviewed the radiological images as listed and agreed with the findings in the report. MR Cervical Spine Wo Contrast Result Date: 10/08/2024 CLINICAL DATA:  Follow-up abnormal cervical spine MRI EXAM: MRI CERVICAL SPINE WITHOUT CONTRAST TECHNIQUE: Multiplanar, multisequence MR  imaging of the cervical spine was performed. No intravenous contrast was administered. COMPARISON:  May 29, 2024 MRI, bone scan June 09, 2024, CT July 22, 2024 FINDINGS: The craniocervical junction is normal. There is a lesion in the left pedicle of C3 which extends into the posterior body of C3 and has low signal on T1, high signal on T2 and STIR. It is unchanged from the prior study.  There is a lesion in the right transverse process of T1. This has high signal on T1 and T2 and low signal on STIR consistent with a hemangioma or lipoma. C2-C3: The disc is normal. Mild facet arthropathy. No spinal stenosis or foraminal stenosis C3-C4: The disc is normal. Mild facet arthropathy. No spinal stenosis or foraminal stenosis C4-C5: There is mild degenerative disc disease. There is mild facet arthropathy. No spinal stenosis or foraminal stenosis C5-C6: There is moderate degenerative disc disease with foraminal spurs. No significant facet disease. There is moderate right neural foraminal stenosis. No spinal stenosis C6-C7: There is a mild disc bulge. There is moderate facet arthropathy on the left with a slight degenerative anterolisthesis. No spinal stenosis or foraminal stenosis C7-T1: Normal IMPRESSION: 1. The lesion in the right transverse process of T1 has MRI imaging characteristics consistent with a benign hemangioma or lipoma. It is unchanged from the prior study. 2. The lesion in the right posterior body of C3 and pedicle of C3 is unchanged from the prior study. 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 Electronically Signed   By: Nancyann Burns M.D.   On: 10/08/2024 11:34    ASSESSMENT & PLAN Rachel Stephens is a 73 y.o. female presenting to [facility] for follow up.   #Abnormal MRI Patient had MR cervical spine on 10/06/24 for follow up of her abnormal cervical spine MRI that was performed on 05/29/24 to evaluate her right neck and shoulder pain.  - Reviewed MRI findings with Dr. Federico  prior to visit.  Discussed with patient that MRI shows no changes, indicating stability.  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  Patient is asymptomatic and no further oncology workup is needed at this time.  Reviewed entirety of workup and findings with patient and she is agreeable with this plan - Workup will be sent to referring provider as well as neurosurgeon.    No orders of the defined types were placed in this encounter.    I have spent a total of 30 minutes minutes of face-to-face and non-face-to-face time, preparing to see the patient, obtaining and/or reviewing separately obtained history, performing a medically appropriate examination, counseling and educating the patient, ordering medications/tests/procedures, referring and communicating with other health care professionals, documenting clinical information in the electronic health record, independently interpreting results and communicating results to the patient, and care coordination.   Olamae Ferrara Walisiewicz PA-C Department of Hematology/Oncology [facility] at [facility] Phone: [phone]

## 2024-12-20 ENCOUNTER — Encounter: Payer: Self-pay | Admitting: *Deleted
